# Patient Record
Sex: Female | Born: 1960
Health system: Southern US, Community
[De-identification: ages and names within clinical notes are randomized; demographics above are authoritative.]

## PROBLEM LIST (undated history)

## (undated) DIAGNOSIS — K219 Gastro-esophageal reflux disease without esophagitis: Secondary | ICD-10-CM

## (undated) DIAGNOSIS — C801 Malignant (primary) neoplasm, unspecified: Secondary | ICD-10-CM

## (undated) DIAGNOSIS — K859 Acute pancreatitis without necrosis or infection, unspecified: Secondary | ICD-10-CM

## (undated) DIAGNOSIS — I499 Cardiac arrhythmia, unspecified: Secondary | ICD-10-CM

## (undated) DIAGNOSIS — I10 Essential (primary) hypertension: Secondary | ICD-10-CM

## (undated) HISTORY — PX: ABDOMINAL SURGERY: SHX537

## (undated) HISTORY — PX: OTHER SURGICAL HISTORY: SHX169

## (undated) HISTORY — PX: ABDOMINAL HYSTERECTOMY: SHX81

## (undated) HISTORY — PX: CHOLECYSTECTOMY: SHX55

---

## 2017-06-16 ENCOUNTER — Other Ambulatory Visit: Payer: Self-pay

## 2017-06-16 ENCOUNTER — Emergency Department (HOSPITAL_BASED_OUTPATIENT_CLINIC_OR_DEPARTMENT_OTHER)
Admission: EM | Admit: 2017-06-16 | Discharge: 2017-06-16 | Disposition: A | Discharge status: Home / Self Care | Payer: Medicare Other | Attending: Emergency Medicine | Admitting: Emergency Medicine

## 2017-06-16 ENCOUNTER — Encounter (HOSPITAL_BASED_OUTPATIENT_CLINIC_OR_DEPARTMENT_OTHER): Payer: Self-pay | Admitting: Emergency Medicine

## 2017-06-16 ENCOUNTER — Emergency Department (HOSPITAL_BASED_OUTPATIENT_CLINIC_OR_DEPARTMENT_OTHER): Payer: Medicare Other

## 2017-06-16 DIAGNOSIS — Z79899 Other long term (current) drug therapy: Secondary | ICD-10-CM | POA: Diagnosis not present

## 2017-06-16 DIAGNOSIS — F419 Anxiety disorder, unspecified: Secondary | ICD-10-CM

## 2017-06-16 DIAGNOSIS — R51 Headache: Secondary | ICD-10-CM | POA: Diagnosis present

## 2017-06-16 DIAGNOSIS — Z8541 Personal history of malignant neoplasm of cervix uteri: Secondary | ICD-10-CM | POA: Insufficient documentation

## 2017-06-16 DIAGNOSIS — K861 Other chronic pancreatitis: Secondary | ICD-10-CM

## 2017-06-16 DIAGNOSIS — G4459 Other complicated headache syndrome: Secondary | ICD-10-CM

## 2017-06-16 DIAGNOSIS — I10 Essential (primary) hypertension: Secondary | ICD-10-CM | POA: Diagnosis not present

## 2017-06-16 HISTORY — DX: Cardiac arrhythmia, unspecified: I49.9

## 2017-06-16 HISTORY — DX: Malignant (primary) neoplasm, unspecified: C80.1

## 2017-06-16 HISTORY — DX: Gastro-esophageal reflux disease without esophagitis: K21.9

## 2017-06-16 HISTORY — DX: Essential (primary) hypertension: I10

## 2017-06-16 HISTORY — DX: Acute pancreatitis without necrosis or infection, unspecified: K85.90

## 2017-06-16 LAB — COMPREHENSIVE METABOLIC PANEL
ALBUMIN: 4.5 g/dL (ref 3.5–5.0)
ALK PHOS: 91 U/L (ref 38–126)
ALT: 16 U/L (ref 14–54)
ANION GAP: 10 (ref 5–15)
AST: 21 U/L (ref 15–41)
BILIRUBIN TOTAL: 0.5 mg/dL (ref 0.3–1.2)
BUN: 18 mg/dL (ref 6–20)
CALCIUM: 9.5 mg/dL (ref 8.9–10.3)
CO2: 23 mmol/L (ref 22–32)
CREATININE: 0.89 mg/dL (ref 0.44–1.00)
Chloride: 104 mmol/L (ref 101–111)
GFR calc Af Amer: >60 mL/min (ref >60)
GFR calc non Af Amer: >60 mL/min (ref >60)
GLUCOSE: 91 mg/dL (ref 65–99)
Potassium: 3.4 mmol/L — ABNORMAL LOW (ref 3.5–5.1)
SODIUM: 137 mmol/L (ref 135–145)
TOTAL PROTEIN: 8.5 g/dL — AB (ref 6.5–8.1)

## 2017-06-16 LAB — DIFFERENTIAL
Basophils Absolute: 0 10*3/uL (ref 0.0–0.1)
Basophils Relative: 0 %
EOS PCT: 2 %
Eosinophils Absolute: 0.2 10*3/uL (ref 0.0–0.7)
LYMPHS ABS: 1.7 10*3/uL (ref 0.7–4.0)
LYMPHS PCT: 23 %
Monocytes Absolute: 0.5 10*3/uL (ref 0.1–1.0)
Monocytes Relative: 7 %
NEUTROS ABS: 5 10*3/uL (ref 1.7–7.7)
NEUTROS PCT: 68 %

## 2017-06-16 LAB — URINALYSIS, MICROSCOPIC (REFLEX)

## 2017-06-16 LAB — CBC
HCT: 38 % (ref 36.0–46.0)
HEMOGLOBIN: 13 g/dL (ref 12.0–15.0)
MCH: 30.5 pg (ref 26.0–34.0)
MCHC: 34.2 g/dL (ref 30.0–36.0)
MCV: 89.2 fL (ref 78.0–100.0)
Platelets: 235 10*3/uL (ref 150–400)
RBC: 4.26 MIL/uL (ref 3.87–5.11)
RDW: 12.1 % (ref 11.5–15.5)
WBC: 7.4 10*3/uL (ref 4.0–10.5)

## 2017-06-16 LAB — URINALYSIS, ROUTINE W REFLEX MICROSCOPIC
Bilirubin Urine: NEGATIVE
GLUCOSE, UA: NEGATIVE mg/dL
KETONES UR: NEGATIVE mg/dL
NITRITE: NEGATIVE
PROTEIN: NEGATIVE mg/dL
Specific Gravity, Urine: <1.005 — ABNORMAL LOW (ref 1.005–1.030)
pH: 6 (ref 5.0–8.0)

## 2017-06-16 LAB — RAPID URINE DRUG SCREEN, HOSP PERFORMED
Amphetamines: NOT DETECTED
BARBITURATES: NOT DETECTED
Benzodiazepines: NOT DETECTED
Cocaine: NOT DETECTED
Opiates: NOT DETECTED
TETRAHYDROCANNABINOL: NOT DETECTED

## 2017-06-16 LAB — PROTIME-INR
INR: 0.97
Prothrombin Time: 12.8 seconds (ref 11.4–15.2)

## 2017-06-16 LAB — LIPASE, BLOOD: Lipase: 25 U/L (ref 11–51)

## 2017-06-16 LAB — ETHANOL: Alcohol, Ethyl (B): <10 mg/dL (ref <10)

## 2017-06-16 LAB — APTT: aPTT: 33 seconds (ref 24–36)

## 2017-06-16 LAB — TROPONIN I: Troponin I: <0.03 ng/mL (ref <0.03)

## 2017-06-16 LAB — PREGNANCY, URINE: PREG TEST UR: NEGATIVE

## 2017-06-16 MED ORDER — SODIUM CHLORIDE 0.9 % IV BOLUS (SEPSIS)
1000.0000 mL | Freq: Once | INTRAVENOUS | Status: AC
  Administered 2017-06-16: 1000 mL via INTRAVENOUS

## 2017-06-16 MED ORDER — LORAZEPAM 2 MG/ML IJ SOLN
1.0000 mg | Freq: Once | INTRAMUSCULAR | Status: AC
  Administered 2017-06-16: 1 mg via INTRAVENOUS
  Filled 2017-06-16: qty 1

## 2017-06-16 MED ORDER — HEPARIN SOD (PORK) LOCK FLUSH 100 UNIT/ML IV SOLN
INTRAVENOUS | Status: AC
  Administered 2017-06-16: 500 [IU]
  Filled 2017-06-16: qty 5

## 2017-06-16 MED ORDER — OXYCODONE-ACETAMINOPHEN 5-325 MG PO TABS: 1.0000 | ORAL_TABLET | ORAL | 0 refills | Status: DC | PRN

## 2017-06-16 MED ORDER — HYDROMORPHONE HCL 1 MG/ML IJ SOLN
1.0000 mg | Freq: Once | INTRAMUSCULAR | Status: AC
  Administered 2017-06-16: 1 mg via INTRAVENOUS
  Filled 2017-06-16: qty 1

## 2017-06-16 MED FILL — OXYCODONE-ACETAMINOPHEN 5-3: 5-325 | 1 days supply | Qty: 10 | Fill #0

## 2017-06-16 NOTE — ED Triage Notes (Signed)
Presents with slurred speech and feeling off balance that began yesterday, in the evening. Patient states that she went to bed with dizziness and headache  - patient states that she is stuttering since last night.

## 2017-06-16 NOTE — ED Notes (Signed)
Pt states, "My pancreas is really hurting. I usually get 4 grams of dilaudid when it acts up to make it stop. Please tell the doctor that 1 will not be enough" PRovider notified. Not further orders. Explained to patient that the physician will discuss her results with her shortly and we have given her 1 mg of dilaudid for pain at this time.

## 2017-06-16 NOTE — ED Notes (Signed)
ED Provider at bedside. 

## 2017-06-16 NOTE — ED Provider Notes (Signed)
[facility] EMERGENCY DEPARTMENT Provider Note   CSN: [id number] Arrival date & time: 06/16/17  1151     History   Chief Complaint Chief Complaint  Patient presents with  . Headache  . Aphasia    HPI Timeka Goette is a 56 y.o. female.  Patient is a 56 year old female with a history of hypertension who presents with headache and stuttering speech.  She states that her headache started last night at 10 PM.  At that time she also noticed that she was having trouble with her balance and was having difficulty with her speech which she describes as stuttering speech.  She has a worsening headache to the left side of her head.  She denies any neck pain.  She has had no vomiting or fevers.  She does note ongoing problem with her balance and stuttering speech.  No prior history of stroke.  No prior history of similar headaches.  She states that her speech is generally blurry but denies any other speech changes.  She denies any slurred speech or difficulty getting her words out other than the stuttering.      Past Medical History:  Diagnosis Date  . Cancer (HCC)    cervical  . GERD (gastroesophageal reflux disease)   . Hypertension   . Irregular heart rate   . Pancreatitis     There are no active problems to display for this patient.   Past Surgical History:  Procedure Laterality Date  . ABDOMINAL HYSTERECTOMY    . ABDOMINAL SURGERY    . CHOLECYSTECTOMY    . implanted heart monitor      OB History    No data available       Home Medications    Prior to Admission medications   Medication Sig Start Date End Date Taking? Authorizing Provider  atenolol (TENORMIN) 50 MG tablet Take 50 mg by mouth daily.   Yes [provider]  gabapentin (NEURONTIN) 600 MG tablet Take 600 mg by mouth 4 (four) times daily.   Yes [provider]  tiZANidine (ZANAFLEX) 4 MG capsule Take 4 mg by mouth 4 (four) times daily.   Yes [provider]    oxyCODONE-acetaminophen (PERCOCET) 5-325 MG tablet Take 1-2 tablets by mouth every 4 (four) hours as needed. 06/16/17   Malvin Johns, MD    Family History History reviewed. No pertinent family history.  Social History Social History   Tobacco Use  . Smoking status: Never Smoker  . Smokeless tobacco: Never Used  Substance Use Topics  . Alcohol use: No    Frequency: Never  . Drug use: No     Allergies   Ivp dye [iodinated diagnostic agents]; Ketorolac; Morphine and related; and Tramadol   Review of Systems Review of Systems  Constitutional: Negative for chills, diaphoresis, fatigue and fever.  HENT: Negative for congestion, rhinorrhea and sneezing.   Eyes: Positive for visual disturbance.  Respiratory: Negative for cough, chest tightness and shortness of breath.   Cardiovascular: Negative for chest pain and leg swelling.  Gastrointestinal: Negative for abdominal pain, blood in stool, diarrhea, nausea and vomiting.  Genitourinary: Negative for difficulty urinating, flank pain, frequency and hematuria.  Musculoskeletal: Negative for arthralgias and back pain.  Skin: Negative for rash.  Neurological: Positive for speech difficulty and headaches. Negative for dizziness, weakness and numbness.     Physical Exam Updated Vital Signs BP (!) 138/96 (BP Location: Right Arm)   Pulse (!) 111   Temp (!) 97.5 F (36.4  C) (Oral)   Resp 20   Wt 52.2 kg (115 lb)   SpO2 100%   Physical Exam  Constitutional: She is oriented to person, place, and time. She appears well-developed and well-nourished.  Patient appears to be very anxious and is tremulous in all extremities without noted seizure activity.  HENT:  Head: Normocephalic and atraumatic.  Eyes: Pupils are equal, round, and reactive to light.  Neck: Normal range of motion. Neck supple.  Cardiovascular: Normal rate, regular rhythm and normal heart sounds.  Pulmonary/Chest: Effort normal and breath sounds normal. No  respiratory distress. She has no wheezes. She has no rales. She exhibits no tenderness.  Abdominal: Soft. Bowel sounds are normal. There is no tenderness. There is no rebound and no guarding.  Musculoskeletal: Normal range of motion. She exhibits no edema.  Lymphadenopathy:    She has no cervical adenopathy.  Neurological: She is alert and oriented to person, place, and time.  I do not appreciate any slurred speech.  There is no aphasia.  There is no facial drooping.  She has normal sensation in all nerve quadrants of the face.  She has normal eye movements.  I do not appreciate any visual field deficits.  She has 5 out of 5 strength in all extremities without drift.  She has normal sensation to light touch in all extremities.  Skin: Skin is warm and dry. No rash noted.  Psychiatric: Her mood appears anxious.     ED Treatments / Results  Labs (all labs ordered are listed, but only abnormal results are displayed) Labs Reviewed  COMPREHENSIVE METABOLIC PANEL - Abnormal; Notable for the following components:      Result Value   Potassium 3.4 (*)    Total Protein 8.5 (*)    All other components within normal limits  URINALYSIS, ROUTINE W REFLEX MICROSCOPIC - Abnormal; Notable for the following components:   Specific Gravity, Urine <1.005 (*)    Hgb urine dipstick SMALL (*)    Leukocytes, UA TRACE (*)    All other components within normal limits  URINALYSIS, MICROSCOPIC (REFLEX) - Abnormal; Notable for the following components:   Bacteria, UA RARE (*)    Squamous Epithelial / LPF 0-5 (*)    All other components within normal limits  ETHANOL  PROTIME-INR  APTT  CBC  DIFFERENTIAL  TROPONIN I  RAPID URINE DRUG SCREEN, HOSP PERFORMED  PREGNANCY, URINE  LIPASE, BLOOD    EKG  EKG Interpretation  Date/Time:  Friday June 16 2017 12:12:27 EST Ventricular Rate:  101 PR Interval:    QRS Duration: 94 QT Interval:  368 QTC Calculation: 475 R Axis:   90 Text Interpretation:  Sinus  tachycardia Borderline right axis deviation Minimal ST depression, inferior leads since last tracing no significant change Confirmed by Malvin Johns [id number]) on 06/16/2017 12:14:54 PM       Radiology Ct Head Wo Contrast  Result Date: 06/16/2017 CLINICAL DATA:  Slurred speech with loss of balance since last night. Dizziness with headache. Code stroke. EXAM: CT HEAD WITHOUT CONTRAST TECHNIQUE: Contiguous axial images were obtained from the base of the skull through the vertex without intravenous contrast. COMPARISON:  None. FINDINGS: Brain: The patient's head is mildly tilted in the CT gantry. There is no evidence of acute intracranial hemorrhage, mass lesion, brain edema or extra-axial fluid collection. The ventricles and subarachnoid spaces are appropriately sized for age. There is no CT evidence of acute cortical infarction. Vascular: Intracranial vascular calcifications are present. No hyperdense vessel identified.  Skull: Negative for fracture or focal lesion. Sinuses/Orbits: The visualized paranasal sinuses and mastoid air cells are clear. No orbital abnormalities are seen. Other: None. IMPRESSION: No acute intracranial findings. No evidence of acute stroke or hemorrhage. These results were called by telephone at the time of interpretation on 06/16/2017 at 12:22 pm to Dr. Threasa Beards Paolina Karwowski , who verbally acknowledged these results. Electronically Signed   By: Richardean Sale M.D.   On: 06/16/2017 12:23    Procedures Procedures (including critical care time)  Medications Ordered in ED Medications  sodium chloride 0.9 % bolus 1,000 mL (1,000 mLs Intravenous New Bag/Given 06/16/17 1517)  LORazepam (ATIVAN) injection 1 mg (1 mg Intravenous Given 06/16/17 1252)  HYDROmorphone (DILAUDID) injection 1 mg (1 mg Intravenous Given 06/16/17 1338)     Initial Impression / Assessment and Plan / ED Course  I have reviewed the triage vital signs and the nursing notes.  Pertinent labs & imaging results that  were available during my care of the patient were reviewed by me and considered in my medical decision making (see chart for details).     12:08 pt with stuttering speech and headache with reported balance issues.  Out of window for tPA and VAN negative.  Patient presents with symptoms of headache associated with stuttering speech and balance issues with blurry vision.  Her symptoms are not any classic pattern that would fit a stroke.  She did have a head CT and there is no evidence of acute hemorrhage or infarct as detectable on CT.  She was given a dose of Ativan as she seemed to be very and anxious and panicky and this did resolve her symptoms.  She has no focal deficits.  Her headache is completely resolved.  She does not have symptoms that would be more concerning for subarachnoid hemorrhage.  She is able to ambulate without ataxia.  Her husband states that even though she has denied anxiety history to me that she has had similar type presentations in the past and was on Xanax which her doctor took her off of.  As she was in the emergency department she started having pain in her upper abdomen.  She states that she has a history of pancreatitis and needs something for pain.  I ordered her a dose of Dilaudid and she states that she normally takes 4 mg in the 1 mg will not help her.  I was not comfortable with this given that she just received Ativan as well and she seemed comfortable with 1 mg of Dilaudid.  She does not have any elevation in her lipase.  Her other labs are non-concerning.  She does have some mild ongoing tachycardia.  On discharge conversation, her heart rate was 105.  She does not appear to be in any distress.  She has no shortness of breath or hypoxia.  Her blood pressure is stable.  She is constantly asking for more pain medication which I do not feel is appropriate.  I looked her up in the drug database and she was previously receiving chronic pain medication prescriptions which the  last one she received was a month ago.  She recently moved here.  I gave her a prescription for 10 tablets of oxycodone.  I advised her that we would not be able to give any more than that.  I did give her a resource sheet for possible outpatient follow-up.  Return precautions were given.  Final Clinical Impressions(s) / ED Diagnoses   Final diagnoses:  Other  complicated headache syndrome  Anxiety  Chronic pancreatitis, unspecified pancreatitis type [facility])    ED Discharge Orders        Ordered    oxyCODONE-acetaminophen (PERCOCET) 5-325 MG tablet  Every 4 hours PRN     06/16/17 1603       Malvin Johns, MD 06/16/17 1608

## 2017-06-16 NOTE — ED Notes (Signed)
PT ambulated in hall, she complained of dizziness but stated it was better than it was earlier. She is asking for Dilaudid.

## 2018-03-18 ENCOUNTER — Encounter (HOSPITAL_BASED_OUTPATIENT_CLINIC_OR_DEPARTMENT_OTHER): Payer: Self-pay | Admitting: *Deleted

## 2018-03-18 ENCOUNTER — Emergency Department (HOSPITAL_BASED_OUTPATIENT_CLINIC_OR_DEPARTMENT_OTHER)
Admission: EM | Admit: 2018-03-18 | Discharge: 2018-03-19 | Disposition: A | Discharge status: Home / Self Care | Payer: Medicare Other | Attending: Emergency Medicine | Admitting: Emergency Medicine

## 2018-03-18 ENCOUNTER — Emergency Department (HOSPITAL_BASED_OUTPATIENT_CLINIC_OR_DEPARTMENT_OTHER): Payer: Medicare Other

## 2018-03-18 ENCOUNTER — Other Ambulatory Visit: Payer: Self-pay

## 2018-03-18 DIAGNOSIS — I10 Essential (primary) hypertension: Secondary | ICD-10-CM | POA: Insufficient documentation

## 2018-03-18 DIAGNOSIS — R112 Nausea with vomiting, unspecified: Secondary | ICD-10-CM | POA: Diagnosis not present

## 2018-03-18 DIAGNOSIS — R1013 Epigastric pain: Secondary | ICD-10-CM

## 2018-03-18 DIAGNOSIS — Z79899 Other long term (current) drug therapy: Secondary | ICD-10-CM | POA: Insufficient documentation

## 2018-03-18 DIAGNOSIS — R197 Diarrhea, unspecified: Secondary | ICD-10-CM | POA: Diagnosis not present

## 2018-03-18 DIAGNOSIS — R109 Unspecified abdominal pain: Secondary | ICD-10-CM | POA: Diagnosis present

## 2018-03-18 LAB — CBC
HEMATOCRIT: 36.8 % (ref 36.0–46.0)
HEMOGLOBIN: 12.7 g/dL (ref 12.0–15.0)
MCH: 30.5 pg (ref 26.0–34.0)
MCHC: 34.5 g/dL (ref 30.0–36.0)
MCV: 88.5 fL (ref 78.0–100.0)
Platelets: 263 10*3/uL (ref 150–400)
RBC: 4.16 MIL/uL (ref 3.87–5.11)
RDW: 12.2 % (ref 11.5–15.5)
WBC: 10.3 10*3/uL (ref 4.0–10.5)

## 2018-03-18 LAB — URINALYSIS, MICROSCOPIC (REFLEX)

## 2018-03-18 LAB — URINALYSIS, ROUTINE W REFLEX MICROSCOPIC
BILIRUBIN URINE: NEGATIVE
GLUCOSE, UA: NEGATIVE mg/dL
KETONES UR: NEGATIVE mg/dL
NITRITE: NEGATIVE
PH: 5 (ref 5.0–8.0)
Protein, ur: NEGATIVE mg/dL
Specific Gravity, Urine: 1.025 (ref 1.005–1.030)

## 2018-03-18 LAB — COMPREHENSIVE METABOLIC PANEL
ALBUMIN: 4 g/dL (ref 3.5–5.0)
ALT: 14 U/L (ref 0–44)
AST: 20 U/L (ref 15–41)
Alkaline Phosphatase: 83 U/L (ref 38–126)
Anion gap: 12 (ref 5–15)
BILIRUBIN TOTAL: 0.4 mg/dL (ref 0.3–1.2)
BUN: 24 mg/dL — AB (ref 6–20)
CHLORIDE: 107 mmol/L (ref 98–111)
CO2: 21 mmol/L — AB (ref 22–32)
Calcium: 8.8 mg/dL — ABNORMAL LOW (ref 8.9–10.3)
Creatinine, Ser: 1.01 mg/dL — ABNORMAL HIGH (ref 0.44–1.00)
GFR calc Af Amer: >60 mL/min (ref >60)
GFR calc non Af Amer: >60 mL/min (ref >60)
GLUCOSE: 136 mg/dL — AB (ref 70–99)
POTASSIUM: 3.3 mmol/L — AB (ref 3.5–5.1)
SODIUM: 140 mmol/L (ref 135–145)
TOTAL PROTEIN: 7.6 g/dL (ref 6.5–8.1)

## 2018-03-18 LAB — LIPASE, BLOOD: Lipase: 49 U/L (ref 11–51)

## 2018-03-18 MED ORDER — LORAZEPAM 2 MG/ML IJ SOLN
1.0000 mg | Freq: Once | INTRAMUSCULAR | Status: AC
Start: 2018-03-18 — End: 2018-03-18
  Administered 2018-03-18: 1 mg via INTRAVENOUS
  Filled 2018-03-18: qty 1

## 2018-03-18 MED ORDER — HYDROMORPHONE HCL 1 MG/ML IJ SOLN
1.0000 mg | Freq: Once | INTRAMUSCULAR | Status: AC
  Administered 2018-03-18: 1 mg via INTRAVENOUS
  Filled 2018-03-18: qty 1

## 2018-03-18 MED ORDER — SODIUM CHLORIDE 0.9 % IV BOLUS
1000.0000 mL | Freq: Once | INTRAVENOUS | Status: AC
  Administered 2018-03-18: 1000 mL via INTRAVENOUS

## 2018-03-18 MED ORDER — ONDANSETRON HCL 4 MG/2ML IJ SOLN
4.0000 mg | Freq: Once | INTRAMUSCULAR | Status: AC
  Administered 2018-03-18: 4 mg via INTRAVENOUS
  Filled 2018-03-18: qty 2

## 2018-03-18 MED ORDER — KETOROLAC TROMETHAMINE 30 MG/ML IJ SOLN
30.0000 mg | Freq: Once | INTRAMUSCULAR | Status: AC
Start: 2018-03-18 — End: 2018-03-18
  Administered 2018-03-18: 30 mg via INTRAVENOUS
  Filled 2018-03-18: qty 1

## 2018-03-18 NOTE — ED Notes (Signed)
Pt was given at sprite for PO challenge

## 2018-03-18 NOTE — ED Provider Notes (Signed)
[facility] EMERGENCY DEPARTMENT Provider Note   CSN: [id number] Arrival date & time: 03/18/18  1955     History   Chief Complaint Chief Complaint  Patient presents with  . Abdominal Pain    HPI Rachel Pitts is a 57 y.o. female.  HPI   Rachel Pitts is a 57 year old female with a history of chronic pancreatitis, hypertension, irregular heart rate who presents to the emergency department for evaluation of epigastric and left upper quadrant abdominal pain and vomiting.  Patient reports that she is having a flareup of her pancreatitis pain.  Her symptoms started yesterday.  She reports that she has 10/10 severity constant, sharp epigastric and left upper quadrant pain which radiates to her back.  She has associated vomiting as well as diarrhea.  She reports 5 episodes of nonbloody green diarrhea today.  No recent antibiotic use or travel outside of the country.  She takes Percocet at home as needed for her pancreatitis flare, but this is not been helping with her symptoms.  She denies fevers, chills, hematemesis, flank pain, urinary frequency, dysuria, hematuria, chest pain, shortness of breath, cough, lightheadedness or syncope.  She has had a prior cholecystectomy and hysterectomy procedure.    Per chart review, patient has been evaluated for chronic pancreatitis by several gastroenterologists.  Per a note by Gastroenterologist Dr. Ardelia Mems 05/2015, patient has had >15 ERCPs.  Per his note, her chronic abdominal pain requiring narcotics is unlikely solely the the result of pancreatic pathology.   Past Medical History:  Diagnosis Date  . Cancer (HCC)    cervical  . GERD (gastroesophageal reflux disease)   . Hypertension   . Irregular heart rate   . Pancreatitis     There are no active problems to display for this patient.   Past Surgical History:  Procedure Laterality Date  . ABDOMINAL HYSTERECTOMY    . ABDOMINAL SURGERY    . CHOLECYSTECTOMY    . implanted heart  monitor       OB History   None      Home Medications    Prior to Admission medications   Medication Sig Start Date End Date Taking? Authorizing Provider  atenolol (TENORMIN) 50 MG tablet Take 50 mg by mouth daily.   Yes [provider]  gabapentin (NEURONTIN) 600 MG tablet Take 600 mg by mouth 4 (four) times daily.   Yes [provider]  oxyCODONE-acetaminophen (PERCOCET) 5-325 MG tablet Take 1-2 tablets by mouth every 4 (four) hours as needed. 06/16/17  Yes Malvin Johns, MD  tiZANidine (ZANAFLEX) 4 MG capsule Take 4 mg by mouth 4 (four) times daily.   Yes [provider]    Family History No family history on file.  Social History Social History   Tobacco Use  . Smoking status: Never Smoker  . Smokeless tobacco: Never Used  Substance Use Topics  . Alcohol use: No    Frequency: Never  . Drug use: No     Allergies   Ivp dye [iodinated diagnostic agents]; Ketorolac; Morphine and related; and Tramadol   Review of Systems Review of Systems  Constitutional: Negative for chills and fever.  Eyes: Negative for visual disturbance.  Respiratory: Negative for shortness of breath.   Cardiovascular: Negative for chest pain.  Gastrointestinal: Positive for abdominal pain, diarrhea, nausea and vomiting. Negative for blood in stool.  Genitourinary: Negative for difficulty urinating, dysuria, flank pain, frequency and vaginal bleeding.  Musculoskeletal: Negative for back pain and gait problem.  Skin: Negative  for rash.  Neurological: Negative for syncope and light-headedness.  Psychiatric/Behavioral: Negative for agitation.     Physical Exam Updated Vital Signs BP 138/82   Pulse 99   Temp 98.1 F (36.7 C)   Resp (!) 24   Ht 5' (1.524 m)   Wt 49.9 kg   SpO2 98%   BMI 21.48 kg/m   Physical Exam  Constitutional: She appears well-developed and well-nourished. No distress.  Appears anxious. Holding her abdomen and breathing quickly.     HENT:  Head: Normocephalic and atraumatic.  Mucous memories moist.  Eyes: Conjunctivae are normal. Right eye exhibits no discharge. Left eye exhibits no discharge.  Neck: Normal range of motion.  Cardiovascular:  Tachycardic, regular rhythm.  Pulmonary/Chest: Effort normal and breath sounds normal. No stridor. No respiratory distress. She has no wheezes. She has no rales.  Abdominal:  Abdomen soft and nondistended.  Tender to palpation in the epigastrium as well as the left upper quadrant.  No splenomegaly appreciated.  No guarding, rebound or rigidity.  No CVA tenderness.  Musculoskeletal: Normal range of motion.  Neurological: She is alert. Coordination normal.  Skin: Skin is warm and dry. She is not diaphoretic.  Psychiatric: She has a normal mood and affect. Her behavior is normal.  Nursing note and vitals reviewed.    ED Treatments / Results  Labs (all labs ordered are listed, but only abnormal results are displayed) Labs Reviewed  COMPREHENSIVE METABOLIC PANEL - Abnormal; Notable for the following components:      Result Value   Potassium 3.3 (*)    CO2 21 (*)    Glucose, Bld 136 (*)    BUN 24 (*)    Creatinine, Ser 1.01 (*)    Calcium 8.8 (*)    All other components within normal limits  URINALYSIS, ROUTINE W REFLEX MICROSCOPIC - Abnormal; Notable for the following components:   Hgb urine dipstick SMALL (*)    Leukocytes, UA SMALL (*)    All other components within normal limits  URINALYSIS, MICROSCOPIC (REFLEX) - Abnormal; Notable for the following components:   Bacteria, UA FEW (*)    All other components within normal limits  LIPASE, BLOOD  CBC    EKG None  Radiology Dg Abdomen 1 View  Result Date: 03/18/2018 CLINICAL DATA:  Epigastric pain for 2 days. Prior history of pancreatitis. Nausea and vomiting. EXAM: ABDOMEN - 1 VIEW COMPARISON:  None. FINDINGS: Bowel gas pattern is nonobstructive. No evidence of soft tissue mass or abnormal fluid collection. No  evidence of free intraperitoneal air, although the upper portions of the abdomen are excluded on this exam. No evidence of renal or ureteral calculi. Cholecystectomy clips in the RIGHT upper quadrant. No acute or suspicious osseous finding. IMPRESSION: Negative.  Nonobstructive bowel gas pattern Electronically Signed   By: Franki Cabot M.D.   On: 03/18/2018 23:12    Procedures Procedures (including critical care time)  Medications Ordered in ED Medications  HYDROmorphone (DILAUDID) injection 1 mg (1 mg Intravenous Given 03/18/18 2239)  ondansetron (ZOFRAN) injection 4 mg (4 mg Intravenous Given 03/18/18 2239)  sodium chloride 0.9 % bolus 1,000 mL (0 mLs Intravenous Stopped 03/18/18 2343)  ketorolac (TORADOL) 30 MG/ML injection 30 mg (30 mg Intravenous Given 03/18/18 2344)  LORazepam (ATIVAN) injection 1 mg (1 mg Intravenous Given 03/18/18 2344)  heparin lock flush 100 unit/mL (500 Units Intracatheter Given 03/19/18 0024)     Initial Impression / Assessment and Plan / ED Course  I have reviewed the triage  vital signs and the nursing notes.  Pertinent labs & imaging results that were available during my care of the patient were reviewed by me and considered in my medical decision making (see chart for details).     Patient with a history of chronic pancreatitis and chronic abdominal pain presents to the emergency department for evaluation of vomiting, epigastric pain and diarrhea.  She reports her symptoms are similar to prior flares.  On exam she appears very anxious.  She is tender to palpation in the epigastrium as well as left upper quadrant.  No peritoneal signs and I have no concern for acute surgical abdomen given exam findings.  Lab work unremarkable.  No leukocytosis.  Lipase WNL.  CMP without major lecture light abnormalities, liver enzymes WNL and creatinine WNL.  UA without evidence of infection.  Plain abdominal x-ray without obstructive gas pattern, no acute abnormality.  Patient was  treated with fluids, pain medication and antiemetic in the ED.  She reports subsequent improvement and is tolerating p.o. fluids at the bedside.  Her pain today is likely a flare of her chronic pain for which she has been seen by multiple GI specialists in the past. I do not suspect appendicitis, perforated viscus, bowel obstruction, diverticulitis, nephrolithiasis, pyelonephritis or other emergent abdominal etiology causing her symptoms.  Repeat abdominal exam soft.  Have discussed BRAT diet and importance of oral hydration at home.  Patient requesting Percocet at discharge. She has a history of chronic narcotic use. I have counseled her that we do not manage chronic pain with narcotics from the ED and she will have to follow up with her GI doctor for further pain management strategies.  I discussed return precautions and she agrees and appears reliable.  I discussed this patient with Dr. Johnney Killian who agrees with plan and discharge home.   Final diagnoses:  Epigastric pain  Non-intractable vomiting with nausea, unspecified vomiting type  Diarrhea, unspecified type    ED Discharge Orders    None       Bernarda Caffey 03/19/18 0032    Charlesetta Shanks, MD 03/23/18 [id number]

## 2018-03-18 NOTE — ED Notes (Signed)
Patient transported to X-ray 

## 2018-03-18 NOTE — ED Triage Notes (Addendum)
Pt reports abdominal pain since yesterday with vomiting. States she has a history of pancreatitis and this feels like a flare up. Reports vomited x 2 today. She has a portacath

## 2018-03-19 MED ORDER — HEPARIN SOD (PORK) LOCK FLUSH 100 UNIT/ML IV SOLN
500.0000 [IU] | Freq: Once | INTRAVENOUS | Status: AC
  Administered 2018-03-19: 500 [IU]
  Filled 2018-03-19: qty 5

## 2018-03-19 NOTE — Discharge Instructions (Signed)
Your blood work and abdominal x-ray were reassuring today.  Take your nausea medicine at home as needed for vomiting.  Stay hydrated with plenty of fluids.  Avoid spicy and greasy foods.  Stick to a bland and soft diet.  Return to the ER if you have any new or concerning symptoms like fever, vomiting that will not stop, abdominal pain that worsens or concerns you in any way.

## 2018-03-19 NOTE — ED Notes (Signed)
Pt understood dc material. NAD noted. 

## 2018-11-23 ENCOUNTER — Other Ambulatory Visit: Payer: Self-pay | Admitting: Nurse Practitioner

## 2018-11-23 DIAGNOSIS — N644 Mastodynia: Secondary | ICD-10-CM

## 2021-07-23 ENCOUNTER — Emergency Department (HOSPITAL_BASED_OUTPATIENT_CLINIC_OR_DEPARTMENT_OTHER)
Admission: EM | Admit: 2021-07-23 | Discharge: 2021-07-23 | Disposition: A | Discharge status: Home / Self Care | Payer: Medicare Other | Attending: Emergency Medicine | Admitting: Emergency Medicine

## 2021-07-23 ENCOUNTER — Emergency Department (HOSPITAL_BASED_OUTPATIENT_CLINIC_OR_DEPARTMENT_OTHER): Payer: Medicare Other

## 2021-07-23 ENCOUNTER — Other Ambulatory Visit: Payer: Self-pay

## 2021-07-23 ENCOUNTER — Encounter (HOSPITAL_BASED_OUTPATIENT_CLINIC_OR_DEPARTMENT_OTHER): Payer: Self-pay | Admitting: Emergency Medicine

## 2021-07-23 DIAGNOSIS — Z79899 Other long term (current) drug therapy: Secondary | ICD-10-CM | POA: Insufficient documentation

## 2021-07-23 DIAGNOSIS — I10 Essential (primary) hypertension: Secondary | ICD-10-CM | POA: Insufficient documentation

## 2021-07-23 DIAGNOSIS — R1084 Generalized abdominal pain: Secondary | ICD-10-CM

## 2021-07-23 DIAGNOSIS — R109 Unspecified abdominal pain: Secondary | ICD-10-CM | POA: Diagnosis present

## 2021-07-23 DIAGNOSIS — Z8541 Personal history of malignant neoplasm of cervix uteri: Secondary | ICD-10-CM | POA: Insufficient documentation

## 2021-07-23 LAB — CBC WITH DIFFERENTIAL/PLATELET
Abs Immature Granulocytes: 0.07 10*3/uL (ref 0.00–0.07)
Basophils Absolute: 0.1 10*3/uL (ref 0.0–0.1)
Basophils Relative: 0 %
Eosinophils Absolute: 0.2 10*3/uL (ref 0.0–0.5)
Eosinophils Relative: 1 %
HCT: 38.2 % (ref 36.0–46.0)
Hemoglobin: 13.3 g/dL (ref 12.0–15.0)
Immature Granulocytes: 1 %
Lymphocytes Relative: 15 %
Lymphs Abs: 2 10*3/uL (ref 0.7–4.0)
MCH: 31.3 pg (ref 26.0–34.0)
MCHC: 34.8 g/dL (ref 30.0–36.0)
MCV: 89.9 fL (ref 80.0–100.0)
Monocytes Absolute: 0.9 10*3/uL (ref 0.1–1.0)
Monocytes Relative: 6 %
Neutro Abs: 10.4 10*3/uL — ABNORMAL HIGH (ref 1.7–7.7)
Neutrophils Relative %: 77 %
Platelets: 240 10*3/uL (ref 150–400)
RBC: 4.25 MIL/uL (ref 3.87–5.11)
RDW: 12 % (ref 11.5–15.5)
WBC: 13.5 10*3/uL — ABNORMAL HIGH (ref 4.0–10.5)
nRBC: 0 % (ref 0.0–0.2)

## 2021-07-23 LAB — COMPREHENSIVE METABOLIC PANEL
ALT: 19 U/L (ref 0–44)
AST: 20 U/L (ref 15–41)
Albumin: 4.2 g/dL (ref 3.5–5.0)
Alkaline Phosphatase: 88 U/L (ref 38–126)
Anion gap: 12 (ref 5–15)
BUN: 15 mg/dL (ref 6–20)
CO2: 21 mmol/L — ABNORMAL LOW (ref 22–32)
Calcium: 9 mg/dL (ref 8.9–10.3)
Chloride: 105 mmol/L (ref 98–111)
Creatinine, Ser: 0.85 mg/dL (ref 0.44–1.00)
GFR, Estimated: >60 mL/min (ref >60)
Glucose, Bld: 103 mg/dL — ABNORMAL HIGH (ref 70–99)
Potassium: 3.6 mmol/L (ref 3.5–5.1)
Sodium: 138 mmol/L (ref 135–145)
Total Bilirubin: 0.8 mg/dL (ref 0.3–1.2)
Total Protein: 8.4 g/dL — ABNORMAL HIGH (ref 6.5–8.1)

## 2021-07-23 LAB — LIPASE, BLOOD: Lipase: 41 U/L (ref 11–51)

## 2021-07-23 LAB — LACTIC ACID, PLASMA: Lactic Acid, Venous: 1.2 mmol/L (ref 0.5–1.9)

## 2021-07-23 MED ORDER — ONDANSETRON HCL 4 MG/2ML IJ SOLN
4.0000 mg | Freq: Once | INTRAMUSCULAR | Status: AC
  Administered 2021-07-23: 12:00:00 4 mg via INTRAVENOUS
  Filled 2021-07-23: qty 2

## 2021-07-23 MED ORDER — SODIUM CHLORIDE 0.9 % IV BOLUS
1000.0000 mL | Freq: Once | INTRAVENOUS | Status: AC
  Administered 2021-07-23: 12:00:00 1000 mL via INTRAVENOUS

## 2021-07-23 MED ORDER — HYDROMORPHONE HCL 1 MG/ML IJ SOLN
1.0000 mg | Freq: Once | INTRAMUSCULAR | Status: AC
  Administered 2021-07-23: 13:00:00 1 mg via INTRAVENOUS
  Filled 2021-07-23: qty 1

## 2021-07-23 MED ORDER — HYDROMORPHONE HCL 1 MG/ML IJ SOLN
1.0000 mg | Freq: Once | INTRAMUSCULAR | Status: AC
  Administered 2021-07-23: 12:00:00 1 mg via INTRAVENOUS
  Filled 2021-07-23: qty 1

## 2021-07-23 MED ORDER — DICYCLOMINE HCL 10 MG PO CAPS
10.0000 mg | ORAL_CAPSULE | Freq: Once | ORAL | Status: AC
  Administered 2021-07-23: 15:00:00 10 mg via ORAL
  Filled 2021-07-23: qty 1

## 2021-07-23 NOTE — ED Triage Notes (Signed)
Pt having abdominal pain with rectal and vaginal bleeding, history of ca

## 2021-07-23 NOTE — ED Provider Notes (Signed)
[facility] Provider Note   CSN: [id number] Arrival date & time: 07/23/21  2952     History Chief Complaint  Patient presents with   Abdominal Pain   Rectal Bleeding   Vaginal Bleeding    Rachel Pitts is a 60 y.o. female.  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  Associated with loose stools noted to have bright red blood in her stools as well as bright red blood on toilet paper with wiping.  Denies dysuria, reports decreased urine output.  Denies fevers, chills.  Does report nausea and vomiting (nonbloody nonbilious emesis).  States does not feel similar to prior episodes of pancreatitis.  Not anticoagulated.  Unable to keep pain medications and antiemetics down at home due to her vomiting.      Past Medical History:  Diagnosis Date   Cancer ([place])    cervical   GERD (gastroesophageal reflux disease)    Hypertension    Irregular heart rate    Pancreatitis     There are no problems to display for this patient.   Past Surgical History:  Procedure Laterality Date   ABDOMINAL HYSTERECTOMY     ABDOMINAL SURGERY     CHOLECYSTECTOMY     implanted heart monitor       OB History   No obstetric history on file.     No family history on file.  Social History   Tobacco Use   Smoking status: Never   Smokeless tobacco: Never  Substance Use Topics   Alcohol use: No   Drug use: No    Home Medications Prior to Admission medications   Medication Sig Start Date End Date Taking? Authorizing Provider  atenolol (TENORMIN) 50 MG tablet Take 50 mg  by mouth daily.    [provider]  gabapentin (NEURONTIN) 600 MG tablet Take 600 mg by mouth 4 (four) times daily.    [provider]  oxyCODONE-acetaminophen (PERCOCET) 5-325 MG tablet Take 1-2 tablets by mouth every 4 (four) hours as needed. 06/16/17   Malvin Johns, MD  tiZANidine (ZANAFLEX) 4 MG capsule Take 4 mg by mouth 4 (four) times daily.    [provider]    Allergies    Ivp dye [iodinated contrast media], Ketorolac, Morphine and related, and Tramadol  Review of Systems   Review of Systems  Constitutional:  Negative for chills and fever.  Respiratory:  Negative for shortness of breath.   Cardiovascular:  Negative for chest pain.  Gastrointestinal:  Positive for abdominal pain, blood in stool, diarrhea, nausea and vomiting. Negative for constipation.  Genitourinary:  Positive for decreased urine volume. Negative for dysuria.  Musculoskeletal:  Positive for back pain.  Skin:  Negative for rash and wound.  Allergic/Immunologic: Negative for immunocompromised state.  Neurological:  Negative for weakness.  Hematological:  Negative for adenopathy. Does not bruise/bleed easily.  Psychiatric/Behavioral:  Negative for confusion.   All other systems reviewed and are negative.  Physical Exam Updated Vital Signs BP (!) 165/105    Pulse (!) 102    Temp 98.4 F (36.9 C) (Oral)    Resp 15    Ht 5' (1.524 m)    Wt 47.6 kg  SpO2 100%    BMI 20.51 kg/m   Physical Exam Vitals and nursing note reviewed.  Constitutional:      General: She is not in acute distress.    Appearance: She is well-developed. She is not diaphoretic.     Comments: Rolling around on the bed, appears to be in pain.  HENT:     Head: Normocephalic and atraumatic.  Cardiovascular:     Rate and Rhythm: Regular rhythm. Tachycardia present.     Heart sounds: Normal heart sounds.  Pulmonary:     Effort: Pulmonary effort is normal.     Breath sounds: Normal breath sounds.  Abdominal:      General: A surgical scar is present.     Palpations: Abdomen is soft.     Tenderness: There is abdominal tenderness in the left upper quadrant and left lower quadrant.  Skin:    General: Skin is warm and dry.     Findings: No erythema or rash.  Neurological:     Mental Status: She is alert and oriented to person, place, and time.  Psychiatric:        Behavior: Behavior normal.    ED Results / Procedures / Treatments   Labs (all labs ordered are listed, but only abnormal results are displayed) Labs Reviewed  CBC WITH DIFFERENTIAL/PLATELET - Abnormal; Notable for the following components:      Result Value   WBC 13.5 (*)    Neutro Abs 10.4 (*)    All other components within normal limits  COMPREHENSIVE METABOLIC PANEL - Abnormal; Notable for the following components:   CO2 21 (*)    Glucose, Bld 103 (*)    Total Protein 8.4 (*)    All other components within normal limits  LIPASE, BLOOD  LACTIC ACID, PLASMA  URINALYSIS, ROUTINE W REFLEX MICROSCOPIC    EKG None  Radiology CT Abdomen Pelvis Wo Contrast  Result Date: 07/23/2021 CLINICAL DATA:  Acute left-sided abdominal pain.  Rectal bleeding. EXAM: CT ABDOMEN AND PELVIS WITHOUT CONTRAST TECHNIQUE: Multidetector CT imaging of the abdomen and pelvis was performed following the standard protocol without IV contrast. COMPARISON:  None. FINDINGS: Lower chest: No acute abnormality. Hepatobiliary: Status post cholecystectomy. Left hepatic pneumobilia is noted most consistent with post cholecystectomy status. No biliary dilatation is noted. Liver is unremarkable on these unenhanced images. Pancreas: Unremarkable. No pancreatic ductal dilatation or surrounding inflammatory changes. Spleen: Normal in size without focal abnormality. Adrenals/Urinary Tract: Adrenal glands are unremarkable. Kidneys are normal, without renal calculi, focal lesion, or hydronephrosis. Bladder is unremarkable. Stomach/Bowel: Stomach is within normal limits.  Appendix appears normal. No evidence of bowel wall thickening, distention, or inflammatory changes. Vascular/Lymphatic: No significant vascular findings are present. No enlarged abdominal or pelvic lymph nodes. Reproductive: Status post hysterectomy. No adnexal masses. Other: No abdominal wall hernia or abnormality. No abdominopelvic ascites. Musculoskeletal: No acute or significant osseous findings. IMPRESSION: Status post cholecystectomy. No acute abnormality seen in the abdomen or pelvis. Electronically Signed   By: Marijo Conception M.D.   On: 07/23/2021 12:21    Procedures Procedures   Medications Ordered in ED Medications  dicyclomine (BENTYL) capsule 10 mg (has no administration in time range)  sodium chloride 0.9 % bolus 1,000 mL (0 mLs Intravenous Stopped 07/23/21 1306)  ondansetron (ZOFRAN) injection 4 mg (4 mg Intravenous Given 07/23/21 1139)  HYDROmorphone (DILAUDID) injection 1 mg (1 mg Intravenous Given 07/23/21 1139)  HYDROmorphone (DILAUDID) injection 1 mg (1 mg Intravenous Given 07/23/21 1316)  ED Course  I have reviewed the triage vital signs and the nursing notes.  Pertinent labs & imaging results that were available during my care of the patient were reviewed by me and considered in my medical decision making (see chart for details).  Clinical Course as of 07/23/21 1423  Fri Jul 23, 2021  1415 HR 98 with BP 114/97 at time of DC. [LM]    Clinical Course User Index [LM] Roque Lias   MDM Rules/Calculators/A&P                         This patient presents to the ED for concern of abdominal pain with diarrhea, this involves an extensive number of treatment options, and is a complaint that carries with it a high risk of complications and morbidity.  The differential diagnosis includes gastroenteritis, gastritis, diverticulitis, GI bleed, dissection, mesenteric ischemia, nephrolithiasis.   Additional history obtained:  Additional history obtained from husband  at bedside External records from outside source obtained and reviewed including 06/21/20 outside ER, similar presentation, not well controlled with meds, thought to be possible abdominal migraine with work up otherwise reassuring.    Lab Tests:  I Ordered, reviewed, and interpreted labs.  The pertinent results include: CBC, CMP, lipase, lactic acid   Imaging Studies ordered:  I ordered imaging studies including CT abdomen pelvis without IV contrast due to allergy (shortness of breath) I independently visualized and interpreted imaging which showed no acute abnormality I agree with the radiologist interpretation   Cardiac Monitoring:  The patient was maintained on a cardiac monitor.  I personally viewed and interpreted the cardiac monitored which showed an underlying rhythm of: Normal sinus rhythm, rate 98   Medicines ordered and prescription drug management:  I ordered medication including Dilaudid, Zofran, Bentyl for nausea, vomiting, pain Reevaluation of the patient after these medicines showed that the patient improved appeared to improve, patient appeared more comfortable, vitals improved, patient reported ongoing pain and was given additional dose of Dilaudid.  At time of discharge, requested additional pain medication, will try Bentyl. I have reviewed the patients home medicines and have made adjustments as needed   Critical Interventions:  None   Consultations Obtained:  I requested consultation with the attending ER physician,  and discussed lab and imaging findings as well as pertinent plan - they recommend: follow up with pts GI   Problem List / ED Course:  Work-up today is overall reassuring.  CBC with mild leukocytosis with white count of 13.5, no source for infection found.  CMP without significant changes.  Lipase within normal limits, lactic acid reassuring at 1.2.  Patient has not provided a urine sample, does not have any urinary symptoms.  Urinalysis was  canceled.  CT abdomen pelvis unrevealing.  Plan is for follow-up with patient's GI.   Reevaluation:  After the interventions noted above, I reevaluated the patient and found that they have :improved   Dispostion:  After consideration of the diagnostic results and the patients response to treatment feel that the patent would benefit from follow up with GI.      Final Clinical Impression(s) / ED Diagnoses Final diagnoses:  Generalized abdominal pain    Rx / DC Orders ED Discharge Orders     None        Tacy Learn, PA-C 07/23/21 1423    Hayden Rasmussen, MD 07/23/21 [phone]

## 2021-07-23 NOTE — Discharge Instructions (Addendum)
Follow-up with your gastroenterologist for recheck as discussed.

## 2022-09-17 ENCOUNTER — Other Ambulatory Visit: Payer: Self-pay

## 2022-09-17 ENCOUNTER — Emergency Department (HOSPITAL_BASED_OUTPATIENT_CLINIC_OR_DEPARTMENT_OTHER): Payer: 59

## 2022-09-17 ENCOUNTER — Encounter (HOSPITAL_BASED_OUTPATIENT_CLINIC_OR_DEPARTMENT_OTHER): Payer: Self-pay | Admitting: Emergency Medicine

## 2022-09-17 ENCOUNTER — Emergency Department (HOSPITAL_BASED_OUTPATIENT_CLINIC_OR_DEPARTMENT_OTHER)
Admission: EM | Admit: 2022-09-17 | Discharge: 2022-09-17 | Disposition: A | Discharge status: Home / Self Care | Payer: 59 | Attending: Emergency Medicine | Admitting: Emergency Medicine

## 2022-09-17 DIAGNOSIS — R1013 Epigastric pain: Secondary | ICD-10-CM | POA: Diagnosis not present

## 2022-09-17 DIAGNOSIS — I10 Essential (primary) hypertension: Secondary | ICD-10-CM | POA: Diagnosis not present

## 2022-09-17 DIAGNOSIS — Z8541 Personal history of malignant neoplasm of cervix uteri: Secondary | ICD-10-CM | POA: Diagnosis not present

## 2022-09-17 DIAGNOSIS — R112 Nausea with vomiting, unspecified: Secondary | ICD-10-CM | POA: Diagnosis not present

## 2022-09-17 DIAGNOSIS — R109 Unspecified abdominal pain: Secondary | ICD-10-CM | POA: Diagnosis present

## 2022-09-17 LAB — CBC WITH DIFFERENTIAL/PLATELET
Abs Immature Granulocytes: 0.04 10*3/uL (ref 0.00–0.07)
Basophils Absolute: 0 10*3/uL (ref 0.0–0.1)
Basophils Relative: 0 %
Eosinophils Absolute: 0.1 10*3/uL (ref 0.0–0.5)
Eosinophils Relative: 1 %
HCT: 35.9 % — ABNORMAL LOW (ref 36.0–46.0)
Hemoglobin: 12.5 g/dL (ref 12.0–15.0)
Immature Granulocytes: 1 %
Lymphocytes Relative: 23 %
Lymphs Abs: 1.9 10*3/uL (ref 0.7–4.0)
MCH: 30.2 pg (ref 26.0–34.0)
MCHC: 34.8 g/dL (ref 30.0–36.0)
MCV: 86.7 fL (ref 80.0–100.0)
Monocytes Absolute: 0.5 10*3/uL (ref 0.1–1.0)
Monocytes Relative: 7 %
Neutro Abs: 5.6 10*3/uL (ref 1.7–7.7)
Neutrophils Relative %: 68 %
Platelets: 205 10*3/uL (ref 150–400)
RBC: 4.14 MIL/uL (ref 3.87–5.11)
RDW: 12.3 % (ref 11.5–15.5)
WBC: 8.2 10*3/uL (ref 4.0–10.5)
nRBC: 0 % (ref 0.0–0.2)

## 2022-09-17 LAB — URINALYSIS, MICROSCOPIC (REFLEX)

## 2022-09-17 LAB — URINALYSIS, ROUTINE W REFLEX MICROSCOPIC
Bilirubin Urine: NEGATIVE
Glucose, UA: NEGATIVE mg/dL
Ketones, ur: NEGATIVE mg/dL
Leukocytes,Ua: NEGATIVE
Nitrite: NEGATIVE
Protein, ur: NEGATIVE mg/dL
Specific Gravity, Urine: <=1.005 (ref 1.005–1.030)
pH: 5.5 (ref 5.0–8.0)

## 2022-09-17 LAB — COMPREHENSIVE METABOLIC PANEL
ALT: 13 U/L (ref 0–44)
AST: 16 U/L (ref 15–41)
Albumin: 3.8 g/dL (ref 3.5–5.0)
Alkaline Phosphatase: 71 U/L (ref 38–126)
Anion gap: 8 (ref 5–15)
BUN: 11 mg/dL (ref 8–23)
CO2: 20 mmol/L — ABNORMAL LOW (ref 22–32)
Calcium: 8.2 mg/dL — ABNORMAL LOW (ref 8.9–10.3)
Chloride: 112 mmol/L — ABNORMAL HIGH (ref 98–111)
Creatinine, Ser: 0.78 mg/dL (ref 0.44–1.00)
GFR, Estimated: >60 mL/min (ref >60)
Glucose, Bld: 85 mg/dL (ref 70–99)
Potassium: 2.9 mmol/L — ABNORMAL LOW (ref 3.5–5.1)
Sodium: 140 mmol/L (ref 135–145)
Total Bilirubin: 0.8 mg/dL (ref 0.3–1.2)
Total Protein: 7.4 g/dL (ref 6.5–8.1)

## 2022-09-17 LAB — LIPASE, BLOOD: Lipase: 33 U/L (ref 11–51)

## 2022-09-17 MED ORDER — HEPARIN SOD (PORK) LOCK FLUSH 100 UNIT/ML IV SOLN
INTRAVENOUS | Status: AC
  Administered 2022-09-17: 500 [IU]
  Filled 2022-09-17: qty 5

## 2022-09-17 MED ORDER — HYDROMORPHONE HCL 1 MG/ML IJ SOLN
1.0000 mg | Freq: Once | INTRAMUSCULAR | Status: AC
Start: 2022-09-17 — End: 2022-09-17
  Administered 2022-09-17: 1 mg via INTRAVENOUS
  Filled 2022-09-17: qty 1

## 2022-09-17 MED ORDER — POTASSIUM CHLORIDE CRYS ER 20 MEQ PO TBCR: 20.0000 meq | EXTENDED_RELEASE_TABLET | Freq: Two times a day (BID) | ORAL | 0 refills | Status: DC

## 2022-09-17 MED ORDER — OXYCODONE-ACETAMINOPHEN 5-325 MG PO TABS: 1.0000 | ORAL_TABLET | Freq: Four times a day (QID) | ORAL | 0 refills | Status: DC | PRN

## 2022-09-17 MED ORDER — FENTANYL CITRATE PF 50 MCG/ML IJ SOSY
50.0000 ug | PREFILLED_SYRINGE | Freq: Once | INTRAMUSCULAR | Status: AC
  Administered 2022-09-17: 50 ug via INTRAVENOUS
  Filled 2022-09-17: qty 1

## 2022-09-17 MED ORDER — HEPARIN SOD (PORK) LOCK FLUSH 100 UNIT/ML IV SOLN: 500.0000 [IU] | Freq: Once | INTRAVENOUS | Status: AC

## 2022-09-17 MED ORDER — POTASSIUM CHLORIDE CRYS ER 20 MEQ PO TBCR
40.0000 meq | EXTENDED_RELEASE_TABLET | Freq: Once | ORAL | Status: AC
  Administered 2022-09-17: 40 meq via ORAL
  Filled 2022-09-17: qty 2

## 2022-09-17 MED ORDER — ONDANSETRON HCL 4 MG/2ML IJ SOLN
4.0000 mg | Freq: Once | INTRAMUSCULAR | Status: AC
  Administered 2022-09-17: 4 mg via INTRAVENOUS
  Filled 2022-09-17: qty 2

## 2022-09-17 MED ORDER — PROMETHAZINE HCL 25 MG PO TABS: 25.0000 mg | ORAL_TABLET | Freq: Three times a day (TID) | ORAL | 0 refills | Status: DC | PRN

## 2022-09-17 MED ORDER — SODIUM CHLORIDE 0.9 % IV BOLUS
1000.0000 mL | Freq: Once | INTRAVENOUS | Status: AC
Start: 2022-09-17 — End: 2022-09-17
  Administered 2022-09-17: 1000 mL via INTRAVENOUS

## 2022-09-17 NOTE — ED Notes (Signed)
Unable to get blood from [place] access.

## 2022-09-17 NOTE — ED Provider Notes (Signed)
[facility] Provider Note   CSN: [id number] Arrival date & time: 09/17/22  0801     History  Chief Complaint  Patient presents with   Abdominal Pain    Rachel Pitts is a 62 y.o. female.  Patient here with nausea and vomiting and abdominal pain for the last few days.  History of pancreatitis.  Feels like the same.  History of cervical cancer, acid reflux, hypertension.  History of abdominal hysterectomy cholecystectomy in the past.  Denies any sick contacts.  Denies any fevers or chills.  No weakness or numbness or tingling.  Denies history of alcohol use.  The history is provided by the patient.       Home Medications Prior to Admission medications   Medication Sig Start Date End Date Taking? Authorizing Provider  potassium chloride SA (KLOR-CON M) 20 MEQ tablet Take 1 tablet (20 mEq total) by mouth 2 (two) times daily for 4 days. 09/17/22 09/21/22 Yes Destanie Tibbetts, DO  promethazine (PHENERGAN) 25 MG tablet Take 1 tablet (25 mg total) by mouth every 8 (eight) hours as needed for up to 15 doses for nausea or vomiting. 09/17/22  Yes Verta Riedlinger, DO  atenolol (TENORMIN) 50 MG tablet Take 50 mg by mouth daily.    [provider]  gabapentin (NEURONTIN) 600 MG tablet Take 600 mg by mouth 4 (four) times daily.    [provider]  oxyCODONE-acetaminophen (PERCOCET) 5-325 MG tablet Take 1 tablet by mouth every 6 (six) hours as needed for up to 10 doses. 09/17/22   Chamia Schmutz, DO  tiZANidine (ZANAFLEX) 4 MG capsule Take 4 mg by mouth 4 (four) times daily.    [provider]      Allergies    Ivp dye [iodinated contrast media], Ketorolac, Morphine and related, and Tramadol    Review of Systems   Review of Systems  Physical Exam Updated Vital Signs BP (!) 165/97   Pulse 96   Temp 98.1 F (36.7 C)   Resp 19   Ht 5' (1.524 m)   Wt 49.9 kg   SpO2 100%   BMI 21.48 kg/m  Physical Exam Vitals and  nursing note reviewed.  Constitutional:      General: She is not in acute distress.    Appearance: She is well-developed.  HENT:     Head: Normocephalic and atraumatic.     Nose: Nose normal.  Eyes:     Extraocular Movements: Extraocular movements intact.     Conjunctiva/sclera: Conjunctivae normal.     Pupils: Pupils are equal, round, and reactive to light.  Cardiovascular:     Rate and Rhythm: Normal rate and regular rhythm.     Pulses: Normal pulses.     Heart sounds: Normal heart sounds. No murmur heard. Pulmonary:     Effort: Pulmonary effort is normal. No respiratory distress.     Breath sounds: Normal breath sounds.  Abdominal:     Palpations: Abdomen is soft.     Tenderness: There is abdominal tenderness.  Musculoskeletal:        General: No swelling. Normal range of motion.     Cervical back: Normal range of motion and neck supple.  Skin:    General: Skin is warm and dry.     Capillary Refill: Capillary refill takes less than 2 seconds.  Neurological:     Mental Status: She is alert.  Psychiatric:        Mood and Affect: Mood  normal.     ED Results / Procedures / Treatments   Labs (all labs ordered are listed, but only abnormal results are displayed) Labs Reviewed  CBC WITH DIFFERENTIAL/PLATELET - Abnormal; Notable for the following components:      Result Value   HCT 35.9 (*)    All other components within normal limits  COMPREHENSIVE METABOLIC PANEL - Abnormal; Notable for the following components:   Potassium 2.9 (*)    Chloride 112 (*)    CO2 20 (*)    Calcium 8.2 (*)    All other components within normal limits  URINALYSIS, ROUTINE W REFLEX MICROSCOPIC - Abnormal; Notable for the following components:   Hgb urine dipstick SMALL (*)    All other components within normal limits  URINALYSIS, MICROSCOPIC (REFLEX) - Abnormal; Notable for the following components:   Bacteria, UA RARE (*)    All other components within normal limits  LIPASE, BLOOD     EKG EKG Interpretation  Date/Time:  Saturday September 17 2022 09:17:26 EST Ventricular Rate:  85 PR Interval:  120 QRS Duration: 75 QT Interval:  427 QTC Calculation: 508 R Axis:   75 Text Interpretation: Sinus rhythm Confirmed by Lennice Sites ([id number]) on 09/17/2022 10:07:58 AM  Radiology DG Chest Portable 1 View  Result Date: 09/17/2022 CLINICAL DATA:  62 year old female with abdominal pain. Port-A-Cath. EXAM: PORTABLE CHEST 1 VIEW COMPARISON:  CT Abdomen and Pelvis 0832 hours today. Portable chest 08/19/2022. FINDINGS: Portable AP upright view at 0843 hours. Stable left chest subclavian approach Port-A-Cath. Lung volumes and mediastinal contours within normal limits. Allowing for portable technique the lungs are clear. Chronic left thoracic inlet surgical clips. Visualized tracheal air column is within normal limits. No acute osseous abnormality identified. Nonobstructed visible bowel gas pattern. IMPRESSION: 1. No acute cardiopulmonary abnormality. 2. Stable left chest Port-A-Cath. Electronically Signed   By: Genevie Ann M.D.   On: 09/17/2022 08:49   CT ABDOMEN PELVIS WO CONTRAST  Result Date: 09/17/2022 CLINICAL DATA:  62 year old female with history of acute onset of nonlocalized abdominal pain and vomiting. Prior history of pancreatitis. EXAM: CT ABDOMEN AND PELVIS WITHOUT CONTRAST TECHNIQUE: Multidetector CT imaging of the abdomen and pelvis was performed following the standard protocol without IV contrast. RADIATION DOSE REDUCTION: This exam was performed according to the departmental dose-optimization program which includes automated exposure control, adjustment of the mA and/or kV according to patient size and/or use of iterative reconstruction technique. COMPARISON:  CT of the abdomen and pelvis 08/19/2022. FINDINGS: Lower chest: Unremarkable. Hepatobiliary: No suspicious cystic or solid hepatic lesions confidently identified on today's noncontrast CT examination. Status post  cholecystectomy. Pancreas: No definite pancreatic mass or peripancreatic fluid collections or inflammatory changes noted on today's noncontrast CT examination. Spleen: Unremarkable. Adrenals/Urinary Tract: There are no abnormal calcifications within the collecting system of either kidney, along the course of either ureter, or within the lumen of the urinary bladder. No hydroureteronephrosis or perinephric stranding to suggest urinary tract obstruction at this time. The unenhanced appearance of the kidneys is unremarkable bilaterally. Urinary bladder is unremarkable in appearance. Bilateral adrenal glands are normal in appearance. Stomach/Bowel: Unenhanced appearance of the stomach is unremarkable. No pathologic dilatation of small bowel or colon. Normal appendix. Vascular/Lymphatic: Atherosclerosis in the abdominal aorta and pelvic vasculature. No lymphadenopathy noted in the abdomen or pelvis. Reproductive: Status post hysterectomy. Ovaries are not confidently identified may be surgically absent or atrophic. Other: No significant volume of ascites.  No pneumoperitoneum. Musculoskeletal: There are no aggressive appearing lytic or  blastic lesions noted in the visualized portions of the skeleton. IMPRESSION: 1. No acute findings are noted in the abdomen or pelvis to account for the patient's symptoms. 2. Aortic atherosclerosis. 3. Incidental imaging findings, as above. Electronically Signed   By: Vinnie Langton M.D.   On: 09/17/2022 08:38    Procedures Procedures    Medications Ordered in ED Medications  potassium chloride SA (KLOR-CON M) CR tablet 40 mEq (has no administration in time range)  sodium chloride 0.9 % bolus 1,000 mL (0 mLs Intravenous Stopped 09/17/22 1104)  fentaNYL (SUBLIMAZE) injection 50 mcg (50 mcg Intravenous Given 09/17/22 0907)  ondansetron (ZOFRAN) injection 4 mg (4 mg Intravenous Given 09/17/22 0906)  HYDROmorphone (DILAUDID) injection 1 mg (1 mg Intravenous Given 09/17/22 [id number])     ED Course/ Medical Decision Making/ A&P                             Medical Decision Making Amount and/or Complexity of Data Reviewed Labs: ordered. Radiology: ordered.  Risk Prescription drug management.   Sequoyah Grider is here with abdominal pain.  History of pancreatitis, reflux, hypertension.  History of multiple abdominal surgeries including cholecystectomy hysterectomy.  Differential diagnosis includes pancreatitis versus less likely bowel obstruction, appendicitis, colitis.  Will get CBC, CMP, lipase, urinalysis, CT scan abdomen and pelvis.  It is also possible that this is likely acute on chronic pain.  She takes chronic narcotics for abdominal pain.  Sounds like she had complications with gallbladder surgery in the past.  Per radiology report CT scans normal.  No pancreatitis.  No bowel obstruction.  Overall suspect that this is likely a chronic pain crisis.  Will try to get her comfortable.  Awaiting blood work.  Blood work overall remarkable.  No urinary tract infection.  Per my review and interpretation there is no significant anemia or electrolyte abnormality or kidney injury.  Overall acute on chronic pain.  Will have her follow-up with primary care doctor.  Antiemetics and pain medicine refilled.  Potassium repletion given.  This chart was dictated using voice recognition software.  Despite best efforts to proofread,  errors can occur which can change the documentation meaning.         Final Clinical Impression(s) / ED Diagnoses Final diagnoses:  Epigastric pain    Rx / DC Orders ED Discharge Orders          Ordered    oxyCODONE-acetaminophen (PERCOCET) 5-325 MG tablet  Every 6 hours PRN        09/17/22 1043    promethazine (PHENERGAN) 25 MG tablet  Every 8 hours PRN        09/17/22 1043    potassium chloride SA (KLOR-CON M) 20 MEQ tablet  2 times daily        09/17/22 1127              Cur[place]oloQ[place]a Skye, DO 09/17/22 1130

## 2022-09-17 NOTE — ED Triage Notes (Signed)
Pt reports history of pancreatitis. Pt c/o abdominal pain with vomiting for the past couple of days.

## 2022-09-17 NOTE — Discharge Instructions (Addendum)
Overall lab work today is unremarkable.  Potassium is slightly low but this should improve.  I have also prescribed you potassium supplements for home as well as narcotic pain medicine and Phenergan.  Follow-up with your primary care doctor.

## 2022-10-12 ENCOUNTER — Encounter (HOSPITAL_BASED_OUTPATIENT_CLINIC_OR_DEPARTMENT_OTHER): Payer: Self-pay

## 2022-10-12 ENCOUNTER — Emergency Department (HOSPITAL_BASED_OUTPATIENT_CLINIC_OR_DEPARTMENT_OTHER): Payer: 59

## 2022-10-12 ENCOUNTER — Emergency Department (HOSPITAL_BASED_OUTPATIENT_CLINIC_OR_DEPARTMENT_OTHER)
Admission: EM | Admit: 2022-10-12 | Discharge: 2022-10-12 | Disposition: A | Discharge status: Home / Self Care | Payer: 59 | Attending: Emergency Medicine | Admitting: Emergency Medicine

## 2022-10-12 ENCOUNTER — Other Ambulatory Visit: Payer: Self-pay

## 2022-10-12 ENCOUNTER — Other Ambulatory Visit (HOSPITAL_BASED_OUTPATIENT_CLINIC_OR_DEPARTMENT_OTHER): Payer: Self-pay

## 2022-10-12 DIAGNOSIS — R000 Tachycardia, unspecified: Secondary | ICD-10-CM | POA: Diagnosis not present

## 2022-10-12 DIAGNOSIS — R112 Nausea with vomiting, unspecified: Secondary | ICD-10-CM | POA: Insufficient documentation

## 2022-10-12 DIAGNOSIS — K529 Noninfective gastroenteritis and colitis, unspecified: Secondary | ICD-10-CM

## 2022-10-12 DIAGNOSIS — R109 Unspecified abdominal pain: Secondary | ICD-10-CM | POA: Diagnosis not present

## 2022-10-12 DIAGNOSIS — D72829 Elevated white blood cell count, unspecified: Secondary | ICD-10-CM | POA: Insufficient documentation

## 2022-10-12 LAB — COMPREHENSIVE METABOLIC PANEL
ALT: 13 U/L (ref 0–44)
AST: 19 U/L (ref 15–41)
Albumin: 4.5 g/dL (ref 3.5–5.0)
Alkaline Phosphatase: 87 U/L (ref 38–126)
Anion gap: 11 (ref 5–15)
BUN: 18 mg/dL (ref 8–23)
CO2: 22 mmol/L (ref 22–32)
Calcium: 9.1 mg/dL (ref 8.9–10.3)
Chloride: 106 mmol/L (ref 98–111)
Creatinine, Ser: 1.11 mg/dL — ABNORMAL HIGH (ref 0.44–1.00)
GFR, Estimated: 57 mL/min — ABNORMAL LOW (ref >60)
Glucose, Bld: 128 mg/dL — ABNORMAL HIGH (ref 70–99)
Potassium: 3 mmol/L — ABNORMAL LOW (ref 3.5–5.1)
Sodium: 139 mmol/L (ref 135–145)
Total Bilirubin: 0.9 mg/dL (ref 0.3–1.2)
Total Protein: 9 g/dL — ABNORMAL HIGH (ref 6.5–8.1)

## 2022-10-12 LAB — LIPASE, BLOOD: Lipase: 37 U/L (ref 11–51)

## 2022-10-12 LAB — CBC WITH DIFFERENTIAL/PLATELET
Abs Immature Granulocytes: 0.08 10*3/uL — ABNORMAL HIGH (ref 0.00–0.07)
Basophils Absolute: 0 10*3/uL (ref 0.0–0.1)
Basophils Relative: 0 %
Eosinophils Absolute: 0 10*3/uL (ref 0.0–0.5)
Eosinophils Relative: 0 %
HCT: 41.4 % (ref 36.0–46.0)
Hemoglobin: 14.2 g/dL (ref 12.0–15.0)
Immature Granulocytes: 1 %
Lymphocytes Relative: 13 %
Lymphs Abs: 1.7 10*3/uL (ref 0.7–4.0)
MCH: 29.8 pg (ref 26.0–34.0)
MCHC: 34.3 g/dL (ref 30.0–36.0)
MCV: 87 fL (ref 80.0–100.0)
Monocytes Absolute: 0.8 10*3/uL (ref 0.1–1.0)
Monocytes Relative: 7 %
Neutro Abs: 10.1 10*3/uL — ABNORMAL HIGH (ref 1.7–7.7)
Neutrophils Relative %: 79 %
Platelets: 329 10*3/uL (ref 150–400)
RBC: 4.76 MIL/uL (ref 3.87–5.11)
RDW: 12.2 % (ref 11.5–15.5)
WBC: 12.7 10*3/uL — ABNORMAL HIGH (ref 4.0–10.5)
nRBC: 0 % (ref 0.0–0.2)

## 2022-10-12 MED ORDER — SODIUM CHLORIDE 0.9 % IV SOLN
25.0000 mg | Freq: Once | INTRAVENOUS | Status: DC
  Filled 2022-10-12: qty 1

## 2022-10-12 MED ORDER — AMOXICILLIN-POT CLAVULANATE 875-125 MG PO TABS
1.0000 | ORAL_TABLET | Freq: Two times a day (BID) | ORAL | 0 refills | Status: DC
  Filled 2022-10-12: qty 14, 7d supply, fill #0

## 2022-10-12 MED ORDER — FENTANYL CITRATE PF 50 MCG/ML IJ SOSY
50.0000 ug | PREFILLED_SYRINGE | Freq: Once | INTRAMUSCULAR | Status: AC
  Administered 2022-10-12: 50 ug via INTRAVENOUS
  Filled 2022-10-12: qty 1

## 2022-10-12 MED ORDER — HALOPERIDOL LACTATE 5 MG/ML IJ SOLN
5.0000 mg | Freq: Once | INTRAMUSCULAR | Status: AC
  Administered 2022-10-12: 5 mg via INTRAVENOUS
  Filled 2022-10-12: qty 1

## 2022-10-12 MED ORDER — FENTANYL CITRATE PF 50 MCG/ML IJ SOSY: 100.0000 ug | PREFILLED_SYRINGE | Freq: Once | INTRAMUSCULAR | Status: DC

## 2022-10-12 MED ORDER — PROMETHAZINE HCL 25 MG PO TABS
25.0000 mg | ORAL_TABLET | Freq: Four times a day (QID) | ORAL | 0 refills | Status: DC | PRN
  Filled 2022-10-12: qty 30, 8d supply, fill #0

## 2022-10-12 MED ORDER — SODIUM CHLORIDE 0.9 % IV BOLUS
1000.0000 mL | Freq: Once | INTRAVENOUS | Status: AC
  Administered 2022-10-12: 1000 mL via INTRAVENOUS

## 2022-10-12 MED ORDER — OXYCODONE HCL 5 MG PO TABS
5.0000 mg | ORAL_TABLET | Freq: Four times a day (QID) | ORAL | 0 refills | Status: DC | PRN
  Filled 2022-10-12: qty 10, 3d supply, fill #0

## 2022-10-12 MED ORDER — HEPARIN SOD (PORK) LOCK FLUSH 100 UNIT/ML IV SOLN
500.0000 [IU] | Freq: Once | INTRAVENOUS | Status: AC
  Administered 2022-10-12: 500 [IU]
  Filled 2022-10-12: qty 5

## 2022-10-12 NOTE — Progress Notes (Signed)
Arterial stick done for labs per MD

## 2022-10-12 NOTE — ED Provider Notes (Signed)
[facility] Provider Note   CSN: [id number] Arrival date & time: 10/12/22  [id number]     History  Chief Complaint  Patient presents with   Vomiting    Rachel Pitts is a 62 y.o. female.  Patient here with nausea and vomiting and diarrhea for the last few days.  History of chronic pancreatitis.  Denies any chest pain or shortness of breath or abdominal pain.  Pain mostly in the upper abdomen as prior.  Phenergan at home not helping much.  Denies any weakness or numbness or chills.  Denies any pain with urination.  The history is provided by the patient.       Home Medications Prior to Admission medications   Medication Sig Start Date End Date Taking? Authorizing Provider  amoxicillin-clavulanate (AUGMENTIN) 875-125 MG tablet Take 1 tablet by mouth every 12 (twelve) hours. 10/12/22  Yes Ariyan Sinnett, DO  oxyCODONE (ROXICODONE) 5 MG immediate release tablet Take 1 tablet (5 mg total) by mouth every 6 (six) hours as needed for up to 10 doses. 10/12/22  Yes Blayne Garlick, DO  promethazine (PHENERGAN) 25 MG tablet Take 1 tablet (25 mg total) by mouth every 6 (six) hours as needed for nausea or vomiting. 10/12/22  Yes Kelyse Pask, DO  atenolol (TENORMIN) 50 MG tablet Take 50 mg by mouth daily.    [provider]  gabapentin (NEURONTIN) 600 MG tablet Take 600 mg by mouth 4 (four) times daily.    [provider]  oxyCODONE-acetaminophen (PERCOCET) 5-325 MG tablet Take 1 tablet by mouth every 6 (six) hours as needed for up to 10 doses. 09/17/22   Kyesha Balla, DO  potassium chloride SA (KLOR-CON M) 20 MEQ tablet Take 1 tablet (20 mEq total) by mouth 2 (two) times daily for 4 days. 09/17/22 09/21/22  Shirely Toren, DO  tiZANidine (ZANAFLEX) 4 MG capsule Take 4 mg by mouth 4 (four) times daily.    [provider]      Allergies    Ivp dye [iodinated contrast media], Ketorolac, Morphine and related, and Tramadol     Review of Systems   Review of Systems  Physical Exam Updated Vital Signs BP (!) 183/106   Pulse 95   Temp 98.1 F (36.7 C) (Oral)   Resp (!) 24   Ht 5' (1.524 m)   Wt 47.6 kg   SpO2 99%   BMI 20.51 kg/m  Physical Exam Vitals and nursing note reviewed.  Constitutional:      General: She is not in acute distress.    Appearance: She is well-developed. She is not ill-appearing.  HENT:     Head: Normocephalic and atraumatic.     Nose: Nose normal.     Mouth/Throat:     Mouth: Mucous membranes are moist.  Eyes:     Extraocular Movements: Extraocular movements intact.     Conjunctiva/sclera: Conjunctivae normal.     Pupils: Pupils are equal, round, and reactive to light.  Cardiovascular:     Rate and Rhythm: Normal rate and regular rhythm.     Pulses: Normal pulses.     Heart sounds: No murmur heard. Pulmonary:     Effort: Pulmonary effort is normal. No respiratory distress.     Breath sounds: Normal breath sounds.  Abdominal:     Palpations: Abdomen is soft.     Tenderness: There is abdominal tenderness.  Musculoskeletal:        General: No swelling.  Cervical back: Normal range of motion and neck supple.  Skin:    General: Skin is warm and dry.     Capillary Refill: Capillary refill takes less than 2 seconds.  Neurological:     Mental Status: She is alert.  Psychiatric:        Mood and Affect: Mood normal.     ED Results / Procedures / Treatments   Labs (all labs ordered are listed, but only abnormal results are displayed) Labs Reviewed  CBC WITH DIFFERENTIAL/PLATELET - Abnormal; Notable for the following components:      Result Value   WBC 12.7 (*)    Neutro Abs 10.1 (*)    Abs Immature Granulocytes 0.08 (*)    All other components within normal limits  COMPREHENSIVE METABOLIC PANEL - Abnormal; Notable for the following components:   Potassium 3.0 (*)    Glucose, Bld 128 (*)    Creatinine, Ser 1.11 (*)    Total Protein 9.0 (*)    GFR, Estimated 57  (*)    All other components within normal limits  LIPASE, BLOOD    EKG EKG Interpretation  Date/Time:  Wednesday October 12 2022 08:54:20 EDT Ventricular Rate:  106 PR Interval:  100 QRS Duration: 78 QT Interval:  363 QTC Calculation: 482 R Axis:   80 Text Interpretation: Sinus tachycardia LAE, consider biatrial enlargement Minimal ST depression, inferior leads Confirmed by Lennice Sites ([id number]) on 10/12/2022 9:05:17 AM  Radiology CT ABDOMEN PELVIS WO CONTRAST  Result Date: 10/12/2022 CLINICAL DATA:  Abdominal pain, acute, nonlocalized. Chronic pancreatitis EXAM: CT ABDOMEN AND PELVIS WITHOUT CONTRAST TECHNIQUE: Multidetector CT imaging of the abdomen and pelvis was performed following the standard protocol without IV contrast. RADIATION DOSE REDUCTION: This exam was performed according to the departmental dose-optimization program which includes automated exposure control, adjustment of the mA and/or kV according to patient size and/or use of iterative reconstruction technique. COMPARISON:  09/17/2022 FINDINGS: Lower chest: Included lung bases are clear.  Heart size is normal. Hepatobiliary: No focal liver abnormality is seen. Status post cholecystectomy. No biliary dilatation. Pancreas: Unremarkable. No pancreatic ductal dilatation or surrounding inflammatory changes. Spleen: Normal in size without focal abnormality. Adrenals/Urinary Tract: Adrenal glands are unremarkable. Kidneys are normal, without renal calculi, focal lesion, or hydronephrosis. Bladder is unremarkable. Stomach/Bowel: Stomach within normal limits. No dilated loops of bowel. Mild colonic wall thickening in the rectosigmoid region (series 2, image 58). Normal appendix in the right lower quadrant (series 5, image 37). Vascular/Lymphatic: Scattered aortoiliac atherosclerotic calcifications without aneurysm. No abdominopelvic lymphadenopathy. Reproductive: Status post hysterectomy. No adnexal masses. Other: No free fluid. No  abdominopelvic fluid collection. No pneumoperitoneum. Musculoskeletal: No acute or significant osseous findings. IMPRESSION: 1. Mild colonic wall thickening in the rectosigmoid region, suggestive of an infectious or inflammatory colitis. 2. Otherwise, no acute abdominopelvic findings. 3. Aortic atherosclerosis (ICD10-I70.0). Electronically Signed   By: Davina Poke D.O.   On: 10/12/2022 10:20    Procedures Procedures    Medications Ordered in ED Medications  fentaNYL (SUBLIMAZE) injection 50 mcg (has no administration in time range)  sodium chloride 0.9 % bolus 1,000 mL (1,000 mLs Intravenous New Bag/Given 10/12/22 0956)  fentaNYL (SUBLIMAZE) injection 50 mcg (50 mcg Intravenous Given 10/12/22 0946)  haloperidol lactate (HALDOL) injection 5 mg (5 mg Intravenous Given 10/12/22 [id number])    ED Course/ Medical Decision Making/ A&P  Medical Decision Making Amount and/or Complexity of Data Reviewed Labs: ordered. Radiology: ordered.  Risk Prescription drug management.   Aamori Eichorn is here with abdominal pain and nausea and vomiting.  History of chronic pancreatitis.  Patient arrives with high blood pressure but otherwise normal vitals.  Differential diagnosis possibly acute on chronic pain flare, pancreatitis, less likely bowel obstruction or UTI.  She has EKG that shows sinus rhythm.  No ischemic changes.  Am not concerned about any cardiac or pulmonary problem.  She is having mostly GI symptoms with poor ability to keep p.o.  Will get CBC, CMP and lipase and give IV fluids, IV Phenergan and IV fentanyl and CT scan abdomen and pelvis.  Per my review and interpretation labs no significant anemia or electrolyte abnormality or kidney injury or leukocytosis.  CT scan per radiology report shows may be some mild colitis of the rectosigmoid region.  Will treat with Augmentin.  She is feeling much better after IV fluids and IV pain medicine.  Will give pain medicine and  antiemetics for home.  Discharged in good condition.  Understands return precautions.  This chart was dictated using voice recognition software.  Despite best efforts to proofread,  errors can occur which can change the documentation meaning.         Final Clinical Impression(s) / ED Diagnoses Final diagnoses:  None    Rx / DC Orders ED Discharge Orders          Ordered    amoxicillin-clavulanate (AUGMENTIN) 875-125 MG tablet  Every 12 hours        10/12/22 1026    oxyCODONE (ROXICODONE) 5 MG immediate release tablet  Every 6 hours PRN        10/12/22 1026    promethazine (PHENERGAN) 25 MG tablet  Every 6 hours PRN        10/12/22 1026              Easten Maceachern, DO 10/12/22 1109

## 2022-10-12 NOTE — ED Triage Notes (Signed)
Pt coming in with hx of chronic pancreatitis. Pt reports having N/V/D x4 days. Pt states she's having upper abdominal pain, primarily on the right side which is consistent with when her pancreatitis flare up. Pt reports having a port in place for when she gets fluids/bloodwork/medications in hospital for the pancreatitis. Pt reports feeling lightheaded and SOB with exertion.

## 2022-11-14 ENCOUNTER — Encounter (HOSPITAL_BASED_OUTPATIENT_CLINIC_OR_DEPARTMENT_OTHER): Payer: Self-pay | Admitting: Urology

## 2022-11-14 ENCOUNTER — Emergency Department (HOSPITAL_BASED_OUTPATIENT_CLINIC_OR_DEPARTMENT_OTHER): Payer: 59

## 2022-11-14 ENCOUNTER — Inpatient Hospital Stay (HOSPITAL_BASED_OUTPATIENT_CLINIC_OR_DEPARTMENT_OTHER)
Admission: EM | Admit: 2022-11-14 | Discharge: 2022-11-18 | DRG: 439 | Disposition: A | Discharge status: Home / Self Care | Payer: 59 | Attending: Internal Medicine | Admitting: Internal Medicine

## 2022-11-14 ENCOUNTER — Other Ambulatory Visit: Payer: Self-pay

## 2022-11-14 DIAGNOSIS — K529 Noninfective gastroenteritis and colitis, unspecified: Secondary | ICD-10-CM | POA: Diagnosis present

## 2022-11-14 DIAGNOSIS — N179 Acute kidney failure, unspecified: Secondary | ICD-10-CM | POA: Diagnosis not present

## 2022-11-14 DIAGNOSIS — E876 Hypokalemia: Secondary | ICD-10-CM | POA: Diagnosis not present

## 2022-11-14 DIAGNOSIS — E861 Hypovolemia: Secondary | ICD-10-CM

## 2022-11-14 DIAGNOSIS — E860 Dehydration: Secondary | ICD-10-CM | POA: Diagnosis present

## 2022-11-14 DIAGNOSIS — Z91041 Radiographic dye allergy status: Secondary | ICD-10-CM

## 2022-11-14 DIAGNOSIS — Z79899 Other long term (current) drug therapy: Secondary | ICD-10-CM

## 2022-11-14 DIAGNOSIS — I959 Hypotension, unspecified: Secondary | ICD-10-CM

## 2022-11-14 DIAGNOSIS — G4700 Insomnia, unspecified: Secondary | ICD-10-CM

## 2022-11-14 DIAGNOSIS — Z888 Allergy status to other drugs, medicaments and biological substances status: Secondary | ICD-10-CM

## 2022-11-14 DIAGNOSIS — K861 Other chronic pancreatitis: Secondary | ICD-10-CM | POA: Diagnosis present

## 2022-11-14 DIAGNOSIS — I471 Supraventricular tachycardia, unspecified: Secondary | ICD-10-CM | POA: Diagnosis present

## 2022-11-14 DIAGNOSIS — I10 Essential (primary) hypertension: Secondary | ICD-10-CM | POA: Diagnosis present

## 2022-11-14 DIAGNOSIS — Z885 Allergy status to narcotic agent status: Secondary | ICD-10-CM

## 2022-11-14 DIAGNOSIS — G8929 Other chronic pain: Secondary | ICD-10-CM | POA: Diagnosis present

## 2022-11-14 DIAGNOSIS — R112 Nausea with vomiting, unspecified: Secondary | ICD-10-CM | POA: Diagnosis present

## 2022-11-14 DIAGNOSIS — K859 Acute pancreatitis without necrosis or infection, unspecified: Secondary | ICD-10-CM | POA: Diagnosis not present

## 2022-11-14 DIAGNOSIS — Z886 Allergy status to analgesic agent status: Secondary | ICD-10-CM

## 2022-11-14 DIAGNOSIS — K219 Gastro-esophageal reflux disease without esophagitis: Secondary | ICD-10-CM | POA: Diagnosis present

## 2022-11-14 LAB — URINALYSIS, ROUTINE W REFLEX MICROSCOPIC
Bilirubin Urine: NEGATIVE
Glucose, UA: NEGATIVE mg/dL
Ketones, ur: NEGATIVE mg/dL
Leukocytes,Ua: NEGATIVE
Nitrite: NEGATIVE
Protein, ur: NEGATIVE mg/dL
Specific Gravity, Urine: >=1.03 (ref 1.005–1.030)
pH: 7 (ref 5.0–8.0)

## 2022-11-14 LAB — CBC
HCT: 41.2 % (ref 36.0–46.0)
Hemoglobin: 14.1 g/dL (ref 12.0–15.0)
MCH: 30.1 pg (ref 26.0–34.0)
MCHC: 34.2 g/dL (ref 30.0–36.0)
MCV: 88 fL (ref 80.0–100.0)
Platelets: 303 10*3/uL (ref 150–400)
RBC: 4.68 MIL/uL (ref 3.87–5.11)
RDW: 12.5 % (ref 11.5–15.5)
WBC: 19.1 10*3/uL — ABNORMAL HIGH (ref 4.0–10.5)
nRBC: 0 % (ref 0.0–0.2)

## 2022-11-14 LAB — COMPREHENSIVE METABOLIC PANEL
ALT: 12 U/L (ref 0–44)
AST: 22 U/L (ref 15–41)
Albumin: 4.3 g/dL (ref 3.5–5.0)
Alkaline Phosphatase: 92 U/L (ref 38–126)
Anion gap: 15 (ref 5–15)
BUN: 17 mg/dL (ref 8–23)
CO2: 19 mmol/L — ABNORMAL LOW (ref 22–32)
Calcium: 9.1 mg/dL (ref 8.9–10.3)
Chloride: 101 mmol/L (ref 98–111)
Creatinine, Ser: 1.01 mg/dL — ABNORMAL HIGH (ref 0.44–1.00)
GFR, Estimated: >60 mL/min (ref >60)
Glucose, Bld: 115 mg/dL — ABNORMAL HIGH (ref 70–99)
Potassium: 2.7 mmol/L — CL (ref 3.5–5.1)
Sodium: 135 mmol/L (ref 135–145)
Total Bilirubin: 0.8 mg/dL (ref 0.3–1.2)
Total Protein: 8.4 g/dL — ABNORMAL HIGH (ref 6.5–8.1)

## 2022-11-14 LAB — LIPASE, BLOOD: Lipase: 495 U/L — ABNORMAL HIGH (ref 11–51)

## 2022-11-14 LAB — URINALYSIS, MICROSCOPIC (REFLEX)

## 2022-11-14 LAB — MAGNESIUM: Magnesium: 1.5 mg/dL — ABNORMAL LOW (ref 1.7–2.4)

## 2022-11-14 MED ORDER — LACTATED RINGERS IV BOLUS
1000.0000 mL | Freq: Once | INTRAVENOUS | Status: AC
  Administered 2022-11-14: 1000 mL via INTRAVENOUS

## 2022-11-14 MED ORDER — HYDROMORPHONE HCL 1 MG/ML IJ SOLN
1.0000 mg | Freq: Once | INTRAMUSCULAR | Status: AC
  Administered 2022-11-14: 1 mg via INTRAVENOUS
  Filled 2022-11-14: qty 1

## 2022-11-14 MED ORDER — SODIUM CHLORIDE 0.9 % IV SOLN: INTRAVENOUS | Status: DC | PRN

## 2022-11-14 MED ORDER — MAGNESIUM OXIDE -MG SUPPLEMENT 400 (240 MG) MG PO TABS
800.0000 mg | ORAL_TABLET | Freq: Once | ORAL | Status: AC
  Administered 2022-11-14: 800 mg via ORAL
  Filled 2022-11-14: qty 2

## 2022-11-14 MED ORDER — HYDROMORPHONE HCL 1 MG/ML IJ SOLN: 1.0000 mg | Freq: Once | INTRAMUSCULAR | Status: DC

## 2022-11-14 MED ORDER — POTASSIUM CHLORIDE 10 MEQ/100ML IV SOLN
10.0000 meq | INTRAVENOUS | Status: AC
  Administered 2022-11-14 (×3): 10 meq via INTRAVENOUS
  Filled 2022-11-14 (×3): qty 100

## 2022-11-14 MED ORDER — LACTATED RINGERS IV SOLN: INTRAVENOUS | Status: DC

## 2022-11-14 MED ORDER — METOCLOPRAMIDE HCL 5 MG/ML IJ SOLN
10.0000 mg | Freq: Once | INTRAMUSCULAR | Status: AC
  Administered 2022-11-14: 10 mg via INTRAVENOUS
  Filled 2022-11-14: qty 2

## 2022-11-14 MED ORDER — ONDANSETRON HCL 4 MG/2ML IJ SOLN
4.0000 mg | Freq: Once | INTRAMUSCULAR | Status: AC
  Administered 2022-11-14: 4 mg via INTRAVENOUS
  Filled 2022-11-14: qty 2

## 2022-11-14 MED ORDER — FENTANYL CITRATE PF 50 MCG/ML IJ SOSY
50.0000 ug | PREFILLED_SYRINGE | Freq: Once | INTRAMUSCULAR | Status: AC
  Administered 2022-11-14: 50 ug via INTRAVENOUS
  Filled 2022-11-14: qty 1

## 2022-11-14 MED ORDER — POTASSIUM CHLORIDE CRYS ER 20 MEQ PO TBCR
60.0000 meq | EXTENDED_RELEASE_TABLET | Freq: Once | ORAL | Status: AC
  Administered 2022-11-14: 60 meq via ORAL
  Filled 2022-11-14: qty 3

## 2022-11-14 MED ORDER — ENOXAPARIN SODIUM 40 MG/0.4ML IJ SOSY
40.0000 mg | PREFILLED_SYRINGE | INTRAMUSCULAR | Status: DC
  Administered 2022-11-15 – 2022-11-18 (×4): 40 mg via SUBCUTANEOUS
  Filled 2022-11-14 (×4): qty 0.4

## 2022-11-14 MED ORDER — SODIUM CHLORIDE 0.9 % IV BOLUS
1000.0000 mL | Freq: Once | INTRAVENOUS | Status: AC
  Administered 2022-11-14: 1000 mL via INTRAVENOUS

## 2022-11-14 NOTE — ED Notes (Signed)
ED TO INPATIENT HANDOFF REPORT  ED Nurse Name and Phone #: Joseph Art [phone]  S Name/Age/Gender Rachel Pitts 61 y.o. female Room/Bed: MH08/MH08  Code Status   Code Status: Not on file  Home/SNF/Other Home Patient oriented to: self, place, time, and situation Is this baseline? Yes   Triage Complete: Triage complete  Chief Complaint Acute pancreatitis [K85.90]  Triage Note Pt states RUQ pain that started 4 days ago  N/V , weakness, reports not tolerating any po intake  Denies fever    Allergies Allergies  Allergen Reactions   Ivp Dye [Iodinated Contrast Media] Shortness Of Breath   Ketorolac Nausea And Vomiting   Morphine And Related Nausea And Vomiting   Tramadol Nausea And Vomiting    Level of Care/Admitting Diagnosis ED Disposition     ED Disposition  Admit   Condition  --   Comment  Hospital Area: [facility] [[id number]]  Level of Care: Med-Surg [16]  Interfacility transfer: Yes  May place patient in observation at [facility] or [facility] if equivalent level of care is available:: Yes  Covid Evaluation: Asymptomatic - no recent exposure (last 10 days) testing not required  Diagnosis: Acute pancreatitis [577.0.ICD-9-CM]  Admitting Physician: Anselm Jungling [[id number]]  Attending Physician: Anselm Jungling [[id number]]          B Medical/Surgery History Past Medical History:  Diagnosis Date   Cancer    cervical   GERD (gastroesophageal reflux disease)    Hypertension    Irregular heart rate    Pancreatitis    Past Surgical History:  Procedure Laterality Date   ABDOMINAL HYSTERECTOMY     ABDOMINAL SURGERY     CHOLECYSTECTOMY     implanted heart monitor       A IV Location/Drains/Wounds Patient Lines/Drains/Airways Status     Active Line/Drains/Airways     Name Placement date Placement time Site Days   Implanted Port Left Chest --  --  Chest  --   Peripheral IV 11/14/22 18 G Anterior;Left;Upper Arm 11/14/22  1328  Arm   less than 1            Intake/Output Last 24 hours  Intake/Output Summary (Last 24 hours) at 11/14/2022 2029 Last data filed at 11/14/2022 1651 Gross per 24 hour  Intake 1001.41 ml  Output --  Net 1001.41 ml    Labs/Imaging Results for orders placed or performed during the hospital encounter of 11/14/22 (from the past 48 hour(s))  Lipase, blood     Status: Abnormal   Collection Time: 11/14/22  1:28 PM  Result Value Ref Range   Lipase 495 (H) 11 - 51 U/L    Comment: RESULTS CONFIRMED BY MANUAL DILUTION Performed at [facility], [address], [place], [place] [address]   Comprehensive metabolic panel     Status: Abnormal   Collection Time: 11/14/22  1:28 PM  Result Value Ref Range   Sodium 135 135 - 145 mmol/L   Potassium 2.7 (LL) 3.5 - 5.1 mmol/L    Comment: CRITICAL RESULT CALLED TO, READ BACK BY AND VERIFIED WITH A NOAH RN 11/14/22 1700 MB   Chloride 101 98 - 111 mmol/L   CO2 19 (L) 22 - 32 mmol/L   Glucose, Bld 115 (H) 70 - 99 mg/dL    Comment: Glucose reference range applies only to samples taken after fasting for at least 8 hours.   BUN 17 8 - 23 mg/dL   Creatinine, Ser 6.57 (H) 0.44 -  1.00 mg/dL   Calcium 9.1 8.9 - 27.2 mg/dL   Total Protein 8.4 (H) 6.5 - 8.1 g/dL   Albumin 4.3 3.5 - 5.0 g/dL   AST 22 15 - 41 U/L   ALT 12 0 - 44 U/L   Alkaline Phosphatase 92 38 - 126 U/L   Total Bilirubin 0.8 0.3 - 1.2 mg/dL   GFR, Estimated >53 >66 mL/min    Comment: (NOTE) Calculated using the CKD-EPI Creatinine Equation (2021)    Anion gap 15 5 - 15    Comment: Performed at [facility], [address] [place], [place] [address]  CBC     Status: Abnormal   Collection Time: 11/14/22  1:28 PM  Result Value Ref Range   WBC 19.1 (H) 4.0 - 10.5 K/uL   RBC 4.68 3.87 - 5.11 MIL/uL   Hemoglobin 14.1 12.0 - 15.0 g/dL   HCT 74.2 59.5 - 63.8 %   MCV 88.0 80.0 - 100.0 fL   MCH 30.1 26.0 - 34.0 pg   MCHC 34.2 30.0 - 36.0 g/dL   RDW 75.6 43.3 -  29.5 %   Platelets 303 150 - 400 K/uL   nRBC 0.0 0.0 - 0.2 %    Comment: Performed at [facility], [address] [place], [place] [address]  Urinalysis, Routine w reflex microscopic -Urine, Clean Catch     Status: Abnormal   Collection Time: 11/14/22  4:12 PM  Result Value Ref Range   Color, Urine STRAW (A) YELLOW   APPearance CLEAR CLEAR   Specific Gravity, Urine >=1.030 1.005 - 1.030   pH 7.0 5.0 - 8.0   Glucose, UA NEGATIVE NEGATIVE mg/dL   Hgb urine dipstick TRACE (A) NEGATIVE   Bilirubin Urine NEGATIVE NEGATIVE   Ketones, ur NEGATIVE NEGATIVE mg/dL   Protein, ur NEGATIVE NEGATIVE mg/dL   Nitrite NEGATIVE NEGATIVE   Leukocytes,Ua NEGATIVE NEGATIVE    Comment: Performed at [facility], [address] [place], [place] [address]  Urinalysis, Microscopic (reflex)     Status: Abnormal   Collection Time: 11/14/22  4:12 PM  Result Value Ref Range   RBC / HPF 0-5 0 - 5 RBC/hpf   WBC, UA 0-5 0 - 5 WBC/hpf   Bacteria, UA RARE (A) NONE SEEN   Squamous Epithelial / HPF 0-5 0 - 5 /HPF    Comment: Performed at [facility], [address] [place], [place] [address]  Magnesium     Status: Abnormal   Collection Time: 11/14/22  5:24 PM  Result Value Ref Range   Magnesium 1.5 (L) 1.7 - 2.4 mg/dL    Comment: Performed at [facility] [address] [place], [place] [address]   CT ABDOMEN PELVIS WO CONTRAST  Result Date: 11/14/2022 CLINICAL DATA:  Abdominal pain, acute, nonlocalized. Chronic pancreatitis. Recent episode of colitis. EXAM: CT ABDOMEN AND PELVIS WITHOUT CONTRAST TECHNIQUE: Multidetector CT imaging of the abdomen and pelvis was performed following the standard protocol without IV contrast. RADIATION DOSE REDUCTION: This exam was performed according to the departmental dose-optimization program which includes automated exposure control, adjustment of the mA and/or kV according to patient size and/or use of iterative reconstruction  technique. COMPARISON:  10/20/2022 FINDINGS: Lower chest: Lung bases are clear. Hepatobiliary: Liver parenchyma is normal without contrast. Previous cholecystectomy. Pancreas: Recurrent pancreatitis of the pancreatic head region with surrounding regional edema. The differential diagnosis does include gastritis/duodenitis or ulcer disease. Spleen:  Normal Adrenals/Urinary Tract: Adrenal glands are normal. Kidneys are normal. No stone or hydronephrosis. Bladder is full but normal. Stomach/Bowel: Majority of the stomach appears normal. See above discussion regarding pancreatitis of the head region versus duodenitis/ulcer disease. Beyond that, the small bowel is normal. Normal appendix. Normal colon. No sign of colitis as was question previously. Vascular/Lymphatic: Aortic atherosclerosis. No aneurysm. IVC is normal. No adenopathy. Reproductive: Previous hysterectomy.  No pelvic mass. Other: No free fluid or air. Musculoskeletal: No significant musculoskeletal finding. IMPRESSION: 1. Abnormal edema in the region of the pancreatic head and duodenal C loop. The differential diagnosis is that of acute pancreatitis versus antritis/duodenitis/ulcer disease. No free air. 2. Previous cholecystectomy and hysterectomy. 3. Aortic atherosclerosis. Aortic Atherosclerosis (ICD10-I70.0). Electronically Signed   By: Paulina Fusi M.D.   On: 11/14/2022 16:03    Pending Labs Unresulted Labs (From admission, onward)    None       Vitals/Pain Today's Vitals   11/14/22 1700 11/14/22 1715 11/14/22 1830 11/14/22 1845  BP: (!) 178/100 (!) 177/98 (!) 182/97 (!) 178/101  Pulse: 94 98 (!) 109 99  Resp:  (!) 22 (!) 27 20  Temp:  98 F (36.7 C)    TempSrc:      SpO2: 91% 92% 99% 99%  Weight:      Height:      PainSc:        Isolation Precautions No active isolations  Medications Medications  potassium chloride 10 mEq in 100 mL IVPB (10 mEq Intravenous New Bag/Given 11/14/22 2003)  0.9 %  sodium chloride infusion (  Intravenous New Bag/Given 11/14/22 1750)  HYDROmorphone (DILAUDID) injection 1 mg (1 mg Intravenous Given 11/14/22 1520)  ondansetron (ZOFRAN) injection 4 mg (4 mg Intravenous Given 11/14/22 1520)  lactated ringers bolus 1,000 mL ( Intravenous Stopped 11/14/22 1650)  HYDROmorphone (DILAUDID) injection 1 mg (1 mg Intravenous Given 11/14/22 1620)  metoCLOPramide (REGLAN) injection 10 mg (10 mg Intravenous Given 11/14/22 1622)  potassium chloride SA (KLOR-CON M) CR tablet 60 mEq (60 mEq Oral Given 11/14/22 1805)  fentaNYL (SUBLIMAZE) injection 50 mcg (50 mcg Intravenous Given 11/14/22 1750)  magnesium oxide (MAG-OX) tablet 800 mg (800 mg Oral Given 11/14/22 1956)  HYDROmorphone (DILAUDID) injection 1 mg (1 mg Intravenous Given 11/14/22 1949)    Mobility walks     Focused Assessments Pt A+O X 4, Skin OK, up to restroom without asst, LBM today.    R Recommendations: See Admitting Provider Note  Report given to:   Additional Notes: Family at bedside, 18 in left upper arm locked, port in left chest with saline @ 100 and K @ 100.

## 2022-11-14 NOTE — Progress Notes (Signed)
Plan of Care Note for accepted transfer   Patient: Rachel Pitts MRN: [id number]   DOA: 11/14/2022  Facility requesting transfer: [facility] ED2 Requesting Provider: Georgeanna Lea, PA Reason for transfer: Acute pancreatitis Facility course: 61 yo F with HTN, SVT, chronic abdominal pain presented with worsening epigastric pain, N/V for 4 days.   Lipase elevated to >400.   CT abd/pelvis wo contrast with abnormal edema in the region of the pancreatic head and duodenal C loop. The differential diagnosis is that of acute pancreatitis versus antritis/duodenitis/ulcer disease. No free air.   Also has hypokalemia 2.7 and AKI.   Has received fluids, pain meds and potassium repletion.   Plan of care: The patient is accepted for admission to Med-surg  unit, at [facility] or [facility] depending on bed availability.  ED PA to continue care of patient while she remains in ED.   Author: Anselm Jungling, DO 11/14/2022  Check [email or web address] for on-call coverage.  Nursing staff, Please call [facility] Admits & Consults System-Wide number on Amion as soon as patient's arrival, so appropriate admitting provider can evaluate the pt.

## 2022-11-14 NOTE — ED Notes (Signed)
Pt made aware for need of urine sample , pt stated she couldn't give one at this time. Will try later .

## 2022-11-14 NOTE — ED Notes (Signed)
Patient transported to X-ray 

## 2022-11-14 NOTE — ED Notes (Signed)
Lipase redrawn and sent to lab

## 2022-11-14 NOTE — ED Provider Notes (Signed)
EMERGENCY DEPARTMENT AT [facility] Provider Note   CSN: [id number] Arrival date & time: 11/14/22  1315     History  Chief Complaint  Patient presents with   Abdominal Pain    Rachel Pitts is a 62 y.o. female past medical history of cholecystectomy and recurrent pancreatitis presenting today with concern for pancreatitis flare.  She says that she has chronic epigastric pain that has not gone away since her last visit to the emergency department a month ago but over the past couple of days it has been worse.  She is been nauseated and vomiting.  Also endorsing some diarrhea.  Patient has not recently seen gastroenterology.  She used to be established with somebody who moved to [facility] and she was trying to get a referral back to see him.  Denies alcohol use.  She does not feel like this was flared by any or drink   Abdominal Pain Associated symptoms: diarrhea, nausea and vomiting   Associated symptoms: no chills and no fever        Home Medications Prior to Admission medications   Medication Sig Start Date End Date Taking? Authorizing Provider  amoxicillin-clavulanate (AUGMENTIN) 875-125 MG tablet Take 1 tablet by mouth every 12 (twelve) hours. 10/12/22   Curatolo, Adam, DO  atenolol (TENORMIN) 50 MG tablet Take 50 mg by mouth daily.    [provider]  gabapentin (NEURONTIN) 600 MG tablet Take 600 mg by mouth 4 (four) times daily.    [provider]  oxyCODONE (ROXICODONE) 5 MG immediate release tablet Take 1 tablet (5 mg total) by mouth every 6 (six) hours as needed for up to 10 doses. 10/12/22   Curatolo, Adam, DO  oxyCODONE-acetaminophen (PERCOCET) 5-325 MG tablet Take 1 tablet by mouth every 6 (six) hours as needed for up to 10 doses. 09/17/22   Curatolo, Adam, DO  potassium chloride SA (KLOR-CON M) 20 MEQ tablet Take 1 tablet (20 mEq total) by mouth 2 (two) times daily for 4 days. 09/17/22 09/21/22  Virgina Norfolk, DO   promethazine (PHENERGAN) 25 MG tablet Take 1 tablet (25 mg total) by mouth every 6 (six) hours as needed for nausea or vomiting. 10/12/22   Curatolo, Adam, DO  tiZANidine (ZANAFLEX) 4 MG capsule Take 4 mg by mouth 4 (four) times daily.    [provider]      Allergies    Ivp dye [iodinated contrast media], Ketorolac, Morphine and related, and Tramadol    Review of Systems   Review of Systems  Constitutional:  Positive for appetite change. Negative for chills and fever.  Gastrointestinal:  Positive for abdominal pain, diarrhea, nausea and vomiting.  Musculoskeletal:  Positive for myalgias.    Physical Exam Updated Vital Signs BP 114/75   Pulse 71   Temp 98 F (36.7 C) (Oral)   Resp (!) 22   Ht 5' (1.524 m)   Wt 47.6 kg   SpO2 100%   BMI 20.49 kg/m  Physical Exam Vitals and nursing note reviewed.  Constitutional:      General: She is not in acute distress.    Appearance: Normal appearance. She is not ill-appearing.  HENT:     Head: Normocephalic and atraumatic.  Eyes:     General: No scleral icterus.    Conjunctiva/sclera: Conjunctivae normal.  Pulmonary:     Effort: Pulmonary effort is normal. No respiratory distress.  Abdominal:     General: Abdomen is flat.     Palpations:  Abdomen is soft.     Tenderness: There is abdominal tenderness in the epigastric area.     Hernia: No hernia is present.  Skin:    Findings: No rash.  Neurological:     Mental Status: She is alert.  Psychiatric:        Mood and Affect: Mood normal.     ED Results / Procedures / Treatments   Labs (all labs ordered are listed, but only abnormal results are displayed) Labs Reviewed  LIPASE, BLOOD - Abnormal; Notable for the following components:      Result Value   Lipase 495 (*)    All other components within normal limits  COMPREHENSIVE METABOLIC PANEL - Abnormal; Notable for the following components:   Potassium 2.7 (*)    CO2 19 (*)    Glucose, Bld 115 (*)    Creatinine,  Ser 1.01 (*)    Total Protein 8.4 (*)    All other components within normal limits  CBC - Abnormal; Notable for the following components:   WBC 19.1 (*)    All other components within normal limits  URINALYSIS, ROUTINE W REFLEX MICROSCOPIC - Abnormal; Notable for the following components:   Color, Urine STRAW (*)    Hgb urine dipstick TRACE (*)    All other components within normal limits  URINALYSIS, MICROSCOPIC (REFLEX) - Abnormal; Notable for the following components:   Bacteria, UA RARE (*)    All other components within normal limits  MAGNESIUM - Abnormal; Notable for the following components:   Magnesium 1.5 (*)    All other components within normal limits    EKG None  Radiology CT ABDOMEN PELVIS WO CONTRAST  Result Date: 11/14/2022 CLINICAL DATA:  Abdominal pain, acute, nonlocalized. Chronic pancreatitis. Recent episode of colitis. EXAM: CT ABDOMEN AND PELVIS WITHOUT CONTRAST TECHNIQUE: Multidetector CT imaging of the abdomen and pelvis was performed following the standard protocol without IV contrast. RADIATION DOSE REDUCTION: This exam was performed according to the departmental dose-optimization program which includes automated exposure control, adjustment of the mA and/or kV according to patient size and/or use of iterative reconstruction technique. COMPARISON:  10/20/2022 FINDINGS: Lower chest: Lung bases are clear. Hepatobiliary: Liver parenchyma is normal without contrast. Previous cholecystectomy. Pancreas: Recurrent pancreatitis of the pancreatic head region with surrounding regional edema. The differential diagnosis does include gastritis/duodenitis or ulcer disease. Spleen: Normal Adrenals/Urinary Tract: Adrenal glands are normal. Kidneys are normal. No stone or hydronephrosis. Bladder is full but normal. Stomach/Bowel: Majority of the stomach appears normal. See above discussion regarding pancreatitis of the head region versus duodenitis/ulcer disease. Beyond that, the  small bowel is normal. Normal appendix. Normal colon. No sign of colitis as was question previously. Vascular/Lymphatic: Aortic atherosclerosis. No aneurysm. IVC is normal. No adenopathy. Reproductive: Previous hysterectomy.  No pelvic mass. Other: No free fluid or air. Musculoskeletal: No significant musculoskeletal finding. IMPRESSION: 1. Abnormal edema in the region of the pancreatic head and duodenal C loop. The differential diagnosis is that of acute pancreatitis versus antritis/duodenitis/ulcer disease. No free air. 2. Previous cholecystectomy and hysterectomy. 3. Aortic atherosclerosis. Aortic Atherosclerosis (ICD10-I70.0). Electronically Signed   By: Paulina Fusi M.D.   On: 11/14/2022 16:03    Procedures Procedures   Medications Ordered in ED Medications  potassium chloride 10 mEq in 100 mL IVPB (10 mEq Intravenous New Bag/Given 11/14/22 1755)  0.9 %  sodium chloride infusion ( Intravenous New Bag/Given 11/14/22 1750)  magnesium oxide (MAG-OX) tablet 800 mg (has no administration in time range)  HYDROmorphone (DILAUDID) injection 1 mg (1 mg Intravenous Given 11/14/22 1520)  ondansetron (ZOFRAN) injection 4 mg (4 mg Intravenous Given 11/14/22 1520)  lactated ringers bolus 1,000 mL ( Intravenous Stopped 11/14/22 1650)  HYDROmorphone (DILAUDID) injection 1 mg (1 mg Intravenous Given 11/14/22 1620)  metoCLOPramide (REGLAN) injection 10 mg (10 mg Intravenous Given 11/14/22 1622)  potassium chloride SA (KLOR-CON M) CR tablet 60 mEq (60 mEq Oral Given 11/14/22 1805)  fentaNYL (SUBLIMAZE) injection 50 mcg (50 mcg Intravenous Given 11/14/22 1750)    ED Course/ Medical Decision Making/ A&P                             Medical Decision Making Amount and/or Complexity of Data Reviewed Labs: ordered. Radiology: ordered.  Risk OTC drugs. Prescription drug management. Decision regarding hospitalization.   62 year old female presenting today with abdominal. The differential diagnosis for generalized  abdominal pain includes, but is not limited to AAA, gastroenteritis, appendicitis, Bowel obstruction, Bowel perforation. Gastroparesis, DKA, Hernia, Inflammatory bowel disease, mesenteric ischemia, pancreatitis, peritonitis SBP, volvulus.   Past Medical History / Co-morbidities / Social History: Cholecystectomy, recurrent pancreatitis   Additional history: Per chart review patient was seen twice earlier this year for the same complaint.  Diagnosed with colitis   Physical Exam: Pertinent physical exam findings include Epigastric and upper quadrant tenderness  Lab Tests: I ordered, and personally interpreted labs.  The pertinent results include: WBC 19.1 Lipase 495 Potassium 2.7, Mg 1.5   Imaging Studies: I ordered and independently visualized and interpreted CT abdomen pelvis and I agree with the radiologist that patient has questionable findings of pancreatitis.  This is a noncontrasted CT however clinically this is likely.   Medications: Fluids, Dilaudid and Zofran only give the patient very mild improvement of her symptoms.  Fentanyl with mild improvement.    MDM/Disposition: This is a 62 year old female who presented today with epigastric pain.  She says that it felt like previous flares of her pancreatitis.  She has not been tolerating p.o. intake and also endorsing some diarrhea over the past couple of days but today it became intolerable.  Workup revealing of a WBC count of 19.1.  Potassium 2.7 as well.  Lipase 495.  At this time patient will be admitted for pancreatitis and hypokalemia.  Not tolerating p.o. intake and no improvement of her pain with Dilaudid and fentanyl.  She is agreeable.    Dr. Cyndia Bent will admit.  Final Clinical Impression(s) / ED Diagnoses Final diagnoses:  Hypokalemia  Acute pancreatitis, unspecified complication status, unspecified pancreatitis type    Rx / DC Orders ED Discharge Orders     None        Saddie Benders, PA-C 11/14/22  1851    Glynn Octave, MD 11/15/22 0830

## 2022-11-14 NOTE — ED Notes (Signed)
Pt has implanted port left chest. Accessed without difficulty . No blood return. Pt reports no blood return last time it was accessed.

## 2022-11-14 NOTE — ED Notes (Signed)
[facility] arrived Patient was asleep. When greeted by [facility] RN patient reported pain of 10/10, EDP made aware and ordered pain medication dependent on Patient vital signs. Blood pressure was low, Medication on hold and EDP ordered bolus of NS .9%

## 2022-11-14 NOTE — ED Triage Notes (Signed)
Pt states RUQ pain that started 4 days ago  N/V , weakness, reports not tolerating any po intake  Denies fever

## 2022-11-14 NOTE — ED Notes (Signed)
Called [facility] for transport at 7:45 pm and spoke to [place].

## 2022-11-14 NOTE — H&P (Addendum)
History and Physical    Patient: Rachel Pitts ZOX:[id number] DOB: 1961/07/11 DOA: 11/14/2022 DOS: the patient was seen and examined on 11/15/2022 PCP: [facility]  Patient coming from:  [facility] ED  Chief Complaint:  Chief Complaint  Patient presents with   Abdominal Pain   HPI: Kaelah Hayashi is a 62 y.o. female with medical history significant of with HTN, SVT, chronic abdominal pain, chronic pancreatitis presented with worsening epigastric pain, N/V for 2 days.   Pain is in epigastric radiation to RUQ. Has been constant with nausea and vomiting.No diarrhea. Not able to keep anything down. Denies NSAIDS or alcohol use. Reports a hx of chronic pancreatitis but has not been hospitalized for it in years. Remote abdominal surgery.   In the ED, normotensive with elevated BP.    Leukocytosis of 19K.  Lipase elevated to >400. Normal LFT.    CT abd/pelvis wo contrast with abnormal edema in the region of the pancreatic head and duodenal C loop. No free air.    Also has hypokalemia 2.7, Hypomagnesia of 1.5 and AKI Cr up from 0.78 to 1.01.    Has received IV fluids, pain meds and potassium repletion.     Review of Systems: As mentioned in the history of present illness. All other systems reviewed and are negative. Past Medical History:  Diagnosis Date   Cancer    cervical   GERD (gastroesophageal reflux disease)    Hypertension    Irregular heart rate    Pancreatitis    Past Surgical History:  Procedure Laterality Date   ABDOMINAL HYSTERECTOMY     ABDOMINAL SURGERY     CHOLECYSTECTOMY     implanted heart monitor     Social History:  reports that she has never smoked. She has never used smokeless tobacco. She reports that she does not drink alcohol and does not use drugs.  Allergies  Allergen Reactions   Ivp Dye [Iodinated Contrast Media] Shortness Of Breath   Ketorolac Nausea And Vomiting   Morphine And Related Nausea And Vomiting   Tramadol Nausea And Vomiting     History reviewed. No pertinent family history.  Prior to Admission medications   Medication Sig Start Date End Date Taking? Authorizing Provider  amoxicillin-clavulanate (AUGMENTIN) 875-125 MG tablet Take 1 tablet by mouth every 12 (twelve) hours. 10/12/22   Curatolo, Adam, DO  atenolol (TENORMIN) 50 MG tablet Take 50 mg by mouth daily.    [provider]  gabapentin (NEURONTIN) 600 MG tablet Take 600 mg by mouth 4 (four) times daily.    [provider]  oxyCODONE (ROXICODONE) 5 MG immediate release tablet Take 1 tablet (5 mg total) by mouth every 6 (six) hours as needed for up to 10 doses. 10/12/22   Curatolo, Adam, DO  oxyCODONE-acetaminophen (PERCOCET) 5-325 MG tablet Take 1 tablet by mouth every 6 (six) hours as needed for up to 10 doses. 09/17/22   Curatolo, Adam, DO  potassium chloride SA (KLOR-CON M) 20 MEQ tablet Take 1 tablet (20 mEq total) by mouth 2 (two) times daily for 4 days. 09/17/22 09/21/22  Virgina Norfolk, DO  promethazine (PHENERGAN) 25 MG tablet Take 1 tablet (25 mg total) by mouth every 6 (six) hours as needed for nausea or vomiting. 10/12/22   Curatolo, Adam, DO  tiZANidine (ZANAFLEX) 4 MG capsule Take 4 mg by mouth 4 (four) times daily.    [provider]    Physical Exam: Vitals:   11/14/22 2245 11/14/22 2255 11/14/22 2340 11/15/22 0000  BP: (!) 75/48  91/60 (!) 88/57  Pulse: 63  68 66  Resp: Temp:  98.2 F (36.8 C) 98.1 F (36.7 C) (!) 97.5 F (36.4 C)  TempSrc:  Oral Oral Oral  SpO2: 95%  100% 100%  Weight:      Height:       Constitutional: NAD, calm, comfortable, non-toxic appearing elderly female laying in bed Eyes: lids and conjunctivae normal ENMT: Mucous membranes are moist.  Neck: normal, supple Respiratory: clear to auscultation bilaterally, no wheezing, no crackles. Normal respiratory effort. No accessory muscle use.  Cardiovascular: Regular rate and rhythm, no murmurs / rubs / gallops. No extremity edema.   Abdomen:soft, non-distended, moderate tenderness to epigastric region and mild pain to RUQ. no masses palpated. No rebound tenderness, guarding or rigidity.  Musculoskeletal: no clubbing / cyanosis. No joint deformity upper and lower extremities. Normal muscle tone.  Skin: no rashes, lesions, ulcers. No induration Neurologic: CN 2-12 grossly intact. Psychiatric: Normal judgment and insight. Alert and oriented x 3. Normal mood. Data Reviewed:  See HPI  Assessment and Plan: * Acute pancreatitis -Lipase elevated to >400. Normal LFT. Has hx of cholecystectomy. Denies ETOH use. Reports hx of chronic pancreatitis. Will check triglycerides  -CT abd/pelvis wo contrast with abnormal edema in the region of the pancreatic head and duodenal C loop.No free air.  -aggressive IV fluid hydration -PRN IV opioid for pain -continue home oxycodone for breakthrough pain    Hypotension -initially had elevated BP in ED but on arrival to [facility] SBP in 90s -keep on continuous IV fluids -not on any home antihypertensives  Insomnia -home Lunesta non-formulary -continue with ambien here  Chronic pain -continue oxycodone IR  q6hr PRN -Have verified [place] substance database and oxycodone script is uptodate and from one prescriber. -IV PRN opioids for more severe pain with ongoing acute pancreatitis  AKI (acute kidney injury) -Cr up from 0.78 to 1.01. -IV fluids overnight and follow renal trend in the morning  Hypomagnesemia -Mg of 1.5 -oral Mg administered    Hypokalemia -repleted with both IV and oral potassium -follow repeat morning BMP      Advance Care Planning:   Code Status: Full Code   Consults: none  Family Communication: none at bedside  Severity of Illness: The appropriate patient status for this patient is OBSERVATION. Observation status is judged to be reasonable and necessary in order to provide the required intensity of service to ensure the patient's safety. The  patient's presenting symptoms, physical exam findings, and initial radiographic and laboratory data in the context of their medical condition is felt to place them at decreased risk for further clinical deterioration. Furthermore, it is anticipated that the patient will be medically stable for discharge from the hospital within 2 midnights of admission.   Author: Anselm Jungling, DO 11/15/2022 12:34 AM  For on call review [email or web address].

## 2022-11-14 NOTE — ED Provider Notes (Signed)
4:27 PM EMERGENCY DEPARTMENT  US GUIDANCE EXAM Emergency Ultrasound:  US Guidance for Needle Guidance  INDICATIONS: Difficult vascular access Linear probe used in real-time to visualize location of needle entry through skin.   PERFORMED BY: Myself IMAGES ARCHIVED?: No LIMITATIONS:  scar tissue VIEWS USED: Transverse INTERPRETATION: Needle visualized within vein, Left arm, and Needle gauge 18    Michalla Ringer, Canary Brim, MD 11/14/22 1627

## 2022-11-14 NOTE — H&P (Incomplete)
History and Physical    PatientMarland Kitchen Clytee Pitts ZOX:[id number] DOB: 19-Aug-1960 DOA: 11/14/2022 DOS: the patient was seen and examined on 11/14/2022 PCP: [facility]  Patient coming from:  [facility] ED  Chief Complaint:  Chief Complaint  Patient presents with  . Abdominal Pain   HPI: Rachel Pitts is a 62 y.o. female with medical history significant of with HTN, SVT, chronic abdominal pain presented with worsening epigastric pain, N/V for 4 days.    In the ED, normotensive with elevated BP.    Leukocytosis of 19K.  Lipase elevated to >400. Normal LFT.    CT abd/pelvis wo contrast with abnormal edema in the region of the pancreatic head and duodenal C loop. The differential diagnosis is that of acute pancreatitis versus antritis/duodenitis/ulcer disease. No free air.    Also has hypokalemia 2.7, Hypomagnesia of 1.5 and AKI Cr up from 0.78 to 1.01.    Has received IV fluids, pain meds and potassium repletion.     Review of Systems: As mentioned in the history of present illness. All other systems reviewed and are negative. Past Medical History:  Diagnosis Date  . Cancer    cervical  . GERD (gastroesophageal reflux disease)   . Hypertension   . Irregular heart rate   . Pancreatitis    Past Surgical History:  Procedure Laterality Date  . ABDOMINAL HYSTERECTOMY    . ABDOMINAL SURGERY    . CHOLECYSTECTOMY    . implanted heart monitor     Social History:  reports that she has never smoked. She has never used smokeless tobacco. She reports that she does not drink alcohol and does not use drugs.  Allergies  Allergen Reactions  . Ivp Dye [Iodinated Contrast Media] Shortness Of Breath  . Ketorolac Nausea And Vomiting  . Morphine And Related Nausea And Vomiting  . Tramadol Nausea And Vomiting    History reviewed. No pertinent family history.  Prior to Admission medications   Medication Sig Start Date End Date Taking? Authorizing Provider  amoxicillin-clavulanate  (AUGMENTIN) 875-125 MG tablet Take 1 tablet by mouth every 12 (twelve) hours. 10/12/22   Curatolo, Adam, DO  atenolol (TENORMIN) 50 MG tablet Take 50 mg by mouth daily.    [provider]  gabapentin (NEURONTIN) 600 MG tablet Take 600 mg by mouth 4 (four) times daily.    [provider]  oxyCODONE (ROXICODONE) 5 MG immediate release tablet Take 1 tablet (5 mg total) by mouth every 6 (six) hours as needed for up to 10 doses. 10/12/22   Curatolo, Adam, DO  oxyCODONE-acetaminophen (PERCOCET) 5-325 MG tablet Take 1 tablet by mouth every 6 (six) hours as needed for up to 10 doses. 09/17/22   Curatolo, Adam, DO  potassium chloride SA (KLOR-CON M) 20 MEQ tablet Take 1 tablet (20 mEq total) by mouth 2 (two) times daily for 4 days. 09/17/22 09/21/22  Virgina Norfolk, DO  promethazine (PHENERGAN) 25 MG tablet Take 1 tablet (25 mg total) by mouth every 6 (six) hours as needed for nausea or vomiting. 10/12/22   Curatolo, Adam, DO  tiZANidine (ZANAFLEX) 4 MG capsule Take 4 mg by mouth 4 (four) times daily.    [provider]    Physical Exam: Vitals:   11/14/22 2130 11/14/22 2245 11/14/22 2255 11/14/22 2340  BP:  (!) 75/48  91/60  Pulse: 78 63  68  Resp: (!) Temp:   98.2 F (36.8 C) 98.1 F (36.7 C)  TempSrc:  Oral Oral  SpO2: 95% 95%  100%  Weight:      Height:       Constitutional: NAD, calm, comfortable Eyes: PERRL, lids and conjunctivae normal ENMT: Mucous membranes are moist. Posterior pharynx clear of any exudate or lesions.Normal dentition.  Neck: normal, supple, no masses, no thyromegaly Respiratory: clear to auscultation bilaterally, no wheezing, no crackles. Normal respiratory effort. No accessory muscle use.  Cardiovascular: Regular rate and rhythm, no murmurs / rubs / gallops. No extremity edema. 2+ pedal pulses. No carotid bruits.  Abdomen: no tenderness, no masses palpated. No hepatosplenomegaly. Bowel sounds positive.  Musculoskeletal: no clubbing /  cyanosis. No joint deformity upper and lower extremities. Good ROM, no contractures. Normal muscle tone.  Skin: no rashes, lesions, ulcers. No induration Neurologic: CN 2-12 grossly intact. Sensation intact, DTR normal. Strength 5/5 in all 4.  Psychiatric: Normal judgment and insight. Alert and oriented x 3. Normal mood. Data Reviewed: {Tip this will not be part of the note when signed- Document your independent interpretation of telemetry tracing, EKG, lab, Radiology test or any other diagnostic tests. Add any new diagnostic test ordered today. (Optional):[id number]} See HPI  Assessment and Plan: No notes have been filed under this hospital service. Service: Hospitalist     Advance Care Planning:   Code Status: Full Code   Consults: none  Family Communication: ***  Severity of Illness: The appropriate patient status for this patient is OBSERVATION. Observation status is judged to be reasonable and necessary in order to provide the required intensity of service to ensure the patient's safety. The patient's presenting symptoms, physical exam findings, and initial radiographic and laboratory data in the context of their medical condition is felt to place them at decreased risk for further clinical deterioration. Furthermore, it is anticipated that the patient will be medically stable for discharge from the hospital within 2 midnights of admission.   Author: Anselm Jungling, DO 11/14/2022 11:55 PM  For on call review [email or web address].

## 2022-11-14 NOTE — ED Notes (Signed)
EDP notified of BP 

## 2022-11-15 DIAGNOSIS — G4700 Insomnia, unspecified: Secondary | ICD-10-CM

## 2022-11-15 DIAGNOSIS — E876 Hypokalemia: Secondary | ICD-10-CM | POA: Diagnosis present

## 2022-11-15 DIAGNOSIS — I959 Hypotension, unspecified: Secondary | ICD-10-CM | POA: Diagnosis present

## 2022-11-15 DIAGNOSIS — G8929 Other chronic pain: Secondary | ICD-10-CM

## 2022-11-15 DIAGNOSIS — K859 Acute pancreatitis without necrosis or infection, unspecified: Secondary | ICD-10-CM | POA: Diagnosis present

## 2022-11-15 DIAGNOSIS — Z885 Allergy status to narcotic agent status: Secondary | ICD-10-CM | POA: Diagnosis not present

## 2022-11-15 DIAGNOSIS — Z886 Allergy status to analgesic agent status: Secondary | ICD-10-CM | POA: Diagnosis not present

## 2022-11-15 DIAGNOSIS — Z888 Allergy status to other drugs, medicaments and biological substances status: Secondary | ICD-10-CM | POA: Diagnosis not present

## 2022-11-15 DIAGNOSIS — N179 Acute kidney failure, unspecified: Secondary | ICD-10-CM

## 2022-11-15 DIAGNOSIS — K529 Noninfective gastroenteritis and colitis, unspecified: Secondary | ICD-10-CM | POA: Diagnosis present

## 2022-11-15 DIAGNOSIS — K861 Other chronic pancreatitis: Secondary | ICD-10-CM | POA: Diagnosis present

## 2022-11-15 DIAGNOSIS — I471 Supraventricular tachycardia, unspecified: Secondary | ICD-10-CM | POA: Diagnosis present

## 2022-11-15 DIAGNOSIS — R112 Nausea with vomiting, unspecified: Secondary | ICD-10-CM | POA: Diagnosis present

## 2022-11-15 DIAGNOSIS — Z91041 Radiographic dye allergy status: Secondary | ICD-10-CM | POA: Diagnosis not present

## 2022-11-15 DIAGNOSIS — K219 Gastro-esophageal reflux disease without esophagitis: Secondary | ICD-10-CM | POA: Diagnosis present

## 2022-11-15 DIAGNOSIS — I10 Essential (primary) hypertension: Secondary | ICD-10-CM | POA: Diagnosis present

## 2022-11-15 DIAGNOSIS — E860 Dehydration: Secondary | ICD-10-CM | POA: Diagnosis present

## 2022-11-15 DIAGNOSIS — Z79899 Other long term (current) drug therapy: Secondary | ICD-10-CM | POA: Diagnosis not present

## 2022-11-15 LAB — CBC
HCT: 33.4 % — ABNORMAL LOW (ref 36.0–46.0)
Hemoglobin: 11.1 g/dL — ABNORMAL LOW (ref 12.0–15.0)
MCH: 30.3 pg (ref 26.0–34.0)
MCHC: 33.2 g/dL (ref 30.0–36.0)
MCV: 91.3 fL (ref 80.0–100.0)
Platelets: 219 10*3/uL (ref 150–400)
RBC: 3.66 MIL/uL — ABNORMAL LOW (ref 3.87–5.11)
RDW: 12.8 % (ref 11.5–15.5)
WBC: 7.2 10*3/uL (ref 4.0–10.5)
nRBC: 0 % (ref 0.0–0.2)

## 2022-11-15 LAB — BASIC METABOLIC PANEL
Anion gap: 10 (ref 5–15)
BUN: 13 mg/dL (ref 8–23)
CO2: 18 mmol/L — ABNORMAL LOW (ref 22–32)
Calcium: 8.4 mg/dL — ABNORMAL LOW (ref 8.9–10.3)
Chloride: 111 mmol/L (ref 98–111)
Creatinine, Ser: 0.86 mg/dL (ref 0.44–1.00)
GFR, Estimated: >60 mL/min (ref >60)
Glucose, Bld: 94 mg/dL (ref 70–99)
Potassium: 4.1 mmol/L (ref 3.5–5.1)
Sodium: 139 mmol/L (ref 135–145)

## 2022-11-15 LAB — LIPASE, BLOOD: Lipase: 187 U/L — ABNORMAL HIGH (ref 11–51)

## 2022-11-15 LAB — TRIGLYCERIDES: Triglycerides: 137 mg/dL (ref <150)

## 2022-11-15 LAB — HIV ANTIBODY (ROUTINE TESTING W REFLEX): HIV Screen 4th Generation wRfx: NONREACTIVE

## 2022-11-15 MED ORDER — HYDROMORPHONE HCL 1 MG/ML IJ SOLN: 1.0000 mg | INTRAMUSCULAR | Status: DC | PRN

## 2022-11-15 MED ORDER — OXYCODONE HCL 5 MG PO TABS
15.0000 mg | ORAL_TABLET | Freq: Four times a day (QID) | ORAL | Status: DC | PRN
  Administered 2022-11-15 – 2022-11-18 (×8): 15 mg via ORAL
  Filled 2022-11-15 (×9): qty 3

## 2022-11-15 MED ORDER — LACTATED RINGERS IV SOLN: INTRAVENOUS | Status: DC

## 2022-11-15 MED ORDER — HEPARIN SOD (PORK) LOCK FLUSH 100 UNIT/ML IV SOLN
500.0000 [IU] | INTRAVENOUS | Status: AC | PRN
  Administered 2022-11-15: 500 [IU]

## 2022-11-15 MED ORDER — ONDANSETRON HCL 4 MG/2ML IJ SOLN
4.0000 mg | Freq: Four times a day (QID) | INTRAMUSCULAR | Status: DC | PRN
  Administered 2022-11-15 – 2022-11-17 (×6): 4 mg via INTRAVENOUS
  Filled 2022-11-15 (×6): qty 2

## 2022-11-15 MED ORDER — HYDROMORPHONE HCL 1 MG/ML IJ SOLN
1.0000 mg | INTRAMUSCULAR | Status: DC | PRN
  Administered 2022-11-15 – 2022-11-17 (×13): 1 mg via INTRAVENOUS
  Filled 2022-11-15 (×13): qty 1

## 2022-11-15 MED ORDER — ZOLPIDEM TARTRATE 5 MG PO TABS
5.0000 mg | ORAL_TABLET | Freq: Every evening | ORAL | Status: DC | PRN
  Administered 2022-11-15 – 2022-11-17 (×4): 5 mg via ORAL
  Filled 2022-11-15 (×4): qty 1

## 2022-11-15 MED ORDER — OXYCODONE HCL 5 MG PO TABS: 15.0000 mg | ORAL_TABLET | Freq: Four times a day (QID) | ORAL | Status: DC | PRN

## 2022-11-15 MED ORDER — PANTOPRAZOLE SODIUM 40 MG PO TBEC
40.0000 mg | DELAYED_RELEASE_TABLET | Freq: Every day | ORAL | Status: DC
  Administered 2022-11-15 – 2022-11-18 (×4): 40 mg via ORAL
  Filled 2022-11-15 (×4): qty 1

## 2022-11-15 NOTE — Assessment & Plan Note (Signed)
-  home Lunesta non-formulary -continue with Remus Loffler here

## 2022-11-15 NOTE — Assessment & Plan Note (Addendum)
-  continue oxycodone IR  q6hr PRN -Have verified [place] substance database and oxycodone script is uptodate and from one prescriber. -IV PRN opioids for more severe pain with ongoing acute pancreatitis

## 2022-11-15 NOTE — Assessment & Plan Note (Signed)
-  repleted with both IV and oral potassium -follow repeat morning BMP

## 2022-11-15 NOTE — TOC CM/SW Note (Signed)
  Transition of Care (TOC) Screening Note   Patient Details  Name: Rachel Pitts Date of Birth: 05-26-1961   Transition of Care [facility]) CM/SW Contact:    Amada Jupiter, LCSW Phone Number: 11/15/2022, 9:49 AM    Transition of Care Department [facility]) has reviewed patient and no TOC needs have been identified at this time. We will continue to monitor patient advancement through interdisciplinary progression rounds. If new patient transition needs arise, please place a TOC consult.

## 2022-11-15 NOTE — Assessment & Plan Note (Signed)
-  Mg of 1.5 -oral Mg administered

## 2022-11-15 NOTE — Plan of Care (Signed)

## 2022-11-15 NOTE — Assessment & Plan Note (Signed)
-  Lipase elevated to >400. Normal LFT. Has hx of cholecystectomy. Denies ETOH use. Reports hx of chronic pancreatitis. Will check triglycerides  -CT abd/pelvis wo contrast with abnormal edema in the region of the pancreatic head and duodenal C loop.No free air.  -aggressive IV fluid hydration -PRN IV opioid for pain -continue home oxycodone for breakthrough pain

## 2022-11-15 NOTE — Progress Notes (Signed)
PROGRESS NOTE    Rachel Pitts  RUE:[id number] DOB: 12-13-1960 DOA: 11/14/2022 PCP: [facility]    Brief Narrative:   Rachel Pitts is a 62 y.o. female with past medical history significant for HTN, SVT, chronic abdominal pain, history of chronic pancreatitis who presented to med [facility] ED on 4/22 with complaints of abdominal pain associate with nausea and vomiting.  Pain is localized to the epigastric region and reports unable to keep any food/water down.  Denies NSAID or alcohol abuse.  Previous history of chronic pancreatitis but has not been hospitalized for many years.  Headache, no fever/chills, no chest pain, no palpitations, no urinary symptoms.  In the ED, temperature 98.0 F, HR 71, RR 22, BP 114/75, SpO2 100% on room air.  WBC 19.1, hemoglobin 14.1, platelets 303.  Sodium 135, potassium 2.7, chloride 101, CO2 19, glucose 115, BUN 17, creat 1.01.  AST 22, ALT 12, total bilirubin 0.8.  Lipase 495.  Urinalysis unrevealing.  CT abdomen/pelvis with abnormal edema region of the pancreatic head and duodenal C-loop, differential of acute pancreatitis versus antritis/duodenitis/ulcer disease, no free air.  Patient received IV potassium replacement, IV fluids, pain medication.  TRH was consulted for admission for further evaluation and management and patient was transferred to [facility].  Assessment & Plan:  Acute Pancreatitis Patient presented to ED with progressive abdominal pain associated with nausea and vomiting.  Lipase elevated 495.  CT abdomen/pelvis notable for edema surrounding the pancreatic head and duodenal C-loop.  Patient with history of chronic pancreatitis in the past.  Denies NSAID, alcohol use.  Triglyceride level 137. -- Lipase 495>187 -- Start Clear liquid diet; will advance further as tolerates -- LR at 125 mL/h -- Oxycodone 15 mg q6h PRN moderate pain -- Dilaudid  IV q3h PRN severe pain -- Zofran IV PRN -- follow CMP, lipase  daily  Hypokalemia Hypomagnesemia Etiology likely secondary to GI loss from nausea/vomiting.  Repleted. -- Repeat electrolytes in a.m.  Acute renal failure Baseline creatinine 0.78, on admission creatinine up to 1.01.  Etiology likely secondary to dehydration from acute pancreatitis as above. -- Continue IVF with LR at 125 mL/h -- CMP in the a.m.  Leukocytosis: Resolved WBC count elevated at 19.1 on admission, etiology likely reactive/hemoconcentration in the setting of dehydration from pancreatitis as above. --WBC 19.1>7.2  Chronic abdominal pain -- Continue home oxycodone 15 mg p.o. every 6 hours as needed  Insomnia -- Ambien 5 mg p.o. nightly as needed  GERD -- Continue PPI     DVT prophylaxis: enoxaparin (LOVENOX) injection 40 mg Start: 11/15/22 1000    Code Status: Full Code Family Communication: Updated spouse present at bedside this morning  Disposition Plan:  Level of care: Med-Surg Status is: Observation The patient remains OBS appropriate and will d/c before 2 midnights.    Consultants:  None  Procedures:  None  Antimicrobials:  None   Subjective: Patient seen examined bedside, resting comfortably.  Lying in bed.  Spouse present.  Continues to report abdominal pain but no further nausea or vomiting.  Wishes to try something to eat/drink this morning.  Discussed with patient will start a clear liquid diet and advance as she tolerates.  No other specific questions or concerns at this time.  Denies headache, no dizziness, no chest pain, no palpitations, no shortness of breath, no fever/chills/night sweats, no diarrhea, no focal weakness, no fatigue, no paresthesias.  No acute events overnight per nursing staff.  Objective: Vitals:   11/15/22 0000  11/15/22 0158 11/15/22 0524 11/15/22 0815  BP: (!) 88/57 103/67 119/66 (!) 152/87  Pulse: 66 79 88 89  Resp: 18 18 18    Temp: (!) 97.5 F (36.4 C) 98.1 F (36.7 C) 98.5 F (36.9 C) 98 F (36.7 C)   TempSrc: Oral Oral Oral Oral  SpO2: 100% 100% 97% 99%  Weight:      Height:        Intake/Output Summary (Last 24 hours) at 11/15/2022 1019 Last data filed at 11/15/2022 1610 Gross per 24 hour  Intake 2187.98 ml  Output 600 ml  Net 1587.98 ml   Filed Weights   11/14/22 1324  Weight: 47.6 kg    Examination:  Physical Exam: GEN: NAD, alert and oriented x 3, wd/wn HEENT: NCAT, PERRL, EOMI, sclera clear, MMM PULM: CTAB w/o wheezes/crackles, normal respiratory effort, on room air CV: RRR w/o M/G/R GI: abd soft, mild tenderness epigastric region, nondistended, NABS, no R/G/M MSK: no peripheral edema, muscle strength globally intact 5/5 bilateral upper/lower extremities NEURO: CN II-XII intact, no focal deficits, sensation to light touch intact PSYCH: normal mood/affect Integumentary: dry/intact, no rashes or wounds    Data Reviewed: I have personally reviewed following labs and imaging studies  CBC: Recent Labs  Lab 11/14/22 1328 11/15/22 0559  WBC 19.1* 7.2  HGB 14.1 11.1*  HCT 41.2 33.4*  MCV 88.0 91.3  PLT 303 219   Basic Metabolic Panel: Recent Labs  Lab 11/14/22 1328 11/14/22 1724 11/15/22 0559  NA 135  --  139  K 2.7*  --  4.1  CL 101  --  111  CO2 19*  --  18*  GLUCOSE 115*  --  94  BUN 17  --  13  CREATININE 1.01*  --  0.86  CALCIUM 9.1  --  8.4*  MG  --  1.5*  --    GFR: Estimated Creatinine Clearance: 49.3 mL/min (by C-G formula based on SCr of 0.86 mg/dL). Liver Function Tests: Recent Labs  Lab 11/14/22 1328  AST 22  ALT 12  ALKPHOS 92  BILITOT 0.8  PROT 8.4*  ALBUMIN 4.3   Recent Labs  Lab 11/14/22 1328 11/15/22 0559  LIPASE 495* 187*   No results for input(s): "AMMONIA" in the last 168 hours. Coagulation Profile: No results for input(s): "INR", "PROTIME" in the last 168 hours. Cardiac Enzymes: No results for input(s): "CKTOTAL", "CKMB", "CKMBINDEX", "TROPONINI" in the last 168 hours. BNP (last 3 results) No results for  input(s): "PROBNP" in the last 8760 hours. HbA1C: No results for input(s): "HGBA1C" in the last 72 hours. CBG: No results for input(s): "GLUCAP" in the last 168 hours. Lipid Profile: Recent Labs    11/15/22 0559  TRIG 137   Thyroid Function Tests: No results for input(s): "TSH", "T4TOTAL", "FREET4", "T3FREE", "THYROIDAB" in the last 72 hours. Anemia Panel: No results for input(s): "VITAMINB12", "FOLATE", "FERRITIN", "TIBC", "IRON", "RETICCTPCT" in the last 72 hours. Sepsis Labs: No results for input(s): "PROCALCITON", "LATICACIDVEN" in the last 168 hours.  No results found for this or any previous visit (from the past 240 hour(s)).       Radiology Studies: CT ABDOMEN PELVIS WO CONTRAST  Result Date: 11/14/2022 CLINICAL DATA:  Abdominal pain, acute, nonlocalized. Chronic pancreatitis. Recent episode of colitis. EXAM: CT ABDOMEN AND PELVIS WITHOUT CONTRAST TECHNIQUE: Multidetector CT imaging of the abdomen and pelvis was performed following the standard protocol without IV contrast. RADIATION DOSE REDUCTION: This exam was performed according to the departmental dose-optimization program which includes  automated exposure control, adjustment of the mA and/or kV according to patient size and/or use of iterative reconstruction technique. COMPARISON:  10/20/2022 FINDINGS: Lower chest: Lung bases are clear. Hepatobiliary: Liver parenchyma is normal without contrast. Previous cholecystectomy. Pancreas: Recurrent pancreatitis of the pancreatic head region with surrounding regional edema. The differential diagnosis does include gastritis/duodenitis or ulcer disease. Spleen: Normal Adrenals/Urinary Tract: Adrenal glands are normal. Kidneys are normal. No stone or hydronephrosis. Bladder is full but normal. Stomach/Bowel: Majority of the stomach appears normal. See above discussion regarding pancreatitis of the head region versus duodenitis/ulcer disease. Beyond that, the small bowel is normal. Normal  appendix. Normal colon. No sign of colitis as was question previously. Vascular/Lymphatic: Aortic atherosclerosis. No aneurysm. IVC is normal. No adenopathy. Reproductive: Previous hysterectomy.  No pelvic mass. Other: No free fluid or air. Musculoskeletal: No significant musculoskeletal finding. IMPRESSION: 1. Abnormal edema in the region of the pancreatic head and duodenal C loop. The differential diagnosis is that of acute pancreatitis versus antritis/duodenitis/ulcer disease. No free air. 2. Previous cholecystectomy and hysterectomy. 3. Aortic atherosclerosis. Aortic Atherosclerosis (ICD10-I70.0). Electronically Signed   By: Paulina Fusi M.D.   On: 11/14/2022 16:03        Scheduled Meds:  enoxaparin (LOVENOX) injection  40 mg Subcutaneous Q24H   pantoprazole  40 mg Oral Daily   Continuous Infusions:  sodium chloride Stopped (11/14/22 2243)   lactated ringers       LOS: 0 days    Time spent: 51 minutes spent on chart review, discussion with nursing staff, consultants, updating family and interview/physical exam; more than 50% of that time was spent in counseling and/or coordination of care.    Alvira Philips [place], DO [facility] Available via Epic secure chat 7am-7pm After these hours, please refer to coverage provider listed on [email or web address] 11/15/2022, 10:19 AM

## 2022-11-15 NOTE — Assessment & Plan Note (Signed)
-  Cr up from 0.78 to 1.01. -IV fluids overnight and follow renal trend in the morning

## 2022-11-15 NOTE — Assessment & Plan Note (Signed)
-  initially had elevated BP in ED but on arrival to [facility] SBP in 90s -keep on continuous IV fluids -not on any home antihypertensives

## 2022-11-16 DIAGNOSIS — K859 Acute pancreatitis without necrosis or infection, unspecified: Secondary | ICD-10-CM | POA: Diagnosis not present

## 2022-11-16 LAB — COMPREHENSIVE METABOLIC PANEL
ALT: 10 U/L (ref 0–44)
AST: 13 U/L — ABNORMAL LOW (ref 15–41)
Albumin: 3.6 g/dL (ref 3.5–5.0)
Alkaline Phosphatase: 74 U/L (ref 38–126)
Anion gap: 12 (ref 5–15)
BUN: 9 mg/dL (ref 8–23)
CO2: 22 mmol/L (ref 22–32)
Calcium: 9.1 mg/dL (ref 8.9–10.3)
Chloride: 104 mmol/L (ref 98–111)
Creatinine, Ser: 0.81 mg/dL (ref 0.44–1.00)
GFR, Estimated: >60 mL/min (ref >60)
Glucose, Bld: 80 mg/dL (ref 70–99)
Potassium: 3.7 mmol/L (ref 3.5–5.1)
Sodium: 138 mmol/L (ref 135–145)
Total Bilirubin: 0.7 mg/dL (ref 0.3–1.2)
Total Protein: 7.1 g/dL (ref 6.5–8.1)

## 2022-11-16 LAB — MAGNESIUM: Magnesium: 1.8 mg/dL (ref 1.7–2.4)

## 2022-11-16 LAB — LIPASE, BLOOD: Lipase: 69 U/L — ABNORMAL HIGH (ref 11–51)

## 2022-11-16 NOTE — Progress Notes (Signed)
62 year old female admitted for acute pancreatitis. Patient complains of abdominal pain and nausea. Temperature: 98.2, HR: 89, BP: 161/90, RR: 18, O2: 96% at room air, and glucose: 80. Alert and oriented x 4. Speech is clear. Patient walks independently with no assisted devices. Uses eyeglasses for vision. Skin is clean, dry, and intact. S1 and S2 heard. Regular rate and rhythm. No murmurs. Lung sounds were auscultated and sounded clear. Active bowel sounds heard in all 4 quadrants. Abdominal pain at RUQ was a 10/10 prior to pain medication. Pain was constant, ongoing, sharp, and radiates towards the right. After pain medication pain reduced to 6/10 and showed signs of relief. Patient also took an antiemetic and states that she no longer feels nauseated. Patent IV line on upper left arm. Patient walks to toilet to urinate. Urine is clear and yellow. Sensation of extremities present bilaterally. No deformities. Pedal pulses 3+ bilaterally.  Mel Almond, Student RN

## 2022-11-16 NOTE — Progress Notes (Signed)
PROGRESS NOTE    Rachel Pitts  WUJ:[id number] DOB: 03/24/1961 DOA: 11/14/2022 PCP: [facility]    Brief Narrative:   Rachel Pitts is a 62 y.o. female with past medical history significant for HTN, SVT, chronic abdominal pain, history of chronic pancreatitis who presented to med [facility] ED on 4/22 with complaints of abdominal pain associate with nausea and vomiting.  Pain is localized to the epigastric region and reports unable to keep any food/water down.  Denies NSAID or alcohol abuse.  Previous history of chronic pancreatitis but has not been hospitalized for many years.  Headache, no fever/chills, no chest pain, no palpitations, no urinary symptoms.  In the ED, temperature 98.0 F, HR 71, RR 22, BP 114/75, SpO2 100% on room air.  WBC 19.1, hemoglobin 14.1, platelets 303.  Sodium 135, potassium 2.7, chloride 101, CO2 19, glucose 115, BUN 17, creat 1.01.  AST 22, ALT 12, total bilirubin 0.8.  Lipase 495.  Urinalysis unrevealing.  CT abdomen/pelvis with abnormal edema region of the pancreatic head and duodenal C-loop, differential of acute pancreatitis versus antritis/duodenitis/ulcer disease, no free air.  Patient received IV potassium replacement, IV fluids, pain medication.  TRH was consulted for admission for further evaluation and management and patient was transferred to [facility].  Assessment & Plan:  Acute Pancreatitis Patient presented to ED with progressive abdominal pain associated with nausea and vomiting.  Lipase elevated 495.  CT abdomen/pelvis notable for edema surrounding the pancreatic head and duodenal C-loop.  Patient with history of chronic pancreatitis in the past.  Denies NSAID, alcohol use.  Triglyceride level 137. -- Lipase [phone] -- Advance to full liquid diet today -- LR at 100 mL/h -- Oxycodone 15 mg q6h PRN moderate pain -- Dilaudid  IV q3h PRN severe pain -- Zofran IV PRN -- follow CMP, lipase  daily  Hypokalemia Hypomagnesemia Etiology likely secondary to GI loss from nausea/vomiting.  Repleted. -- Repeat electrolytes in a.m.  Acute renal failure Baseline creatinine 0.78, on admission creatinine up to 1.01.  Etiology likely secondary to dehydration from acute pancreatitis as above. -- Continue IVF with LR at 100 mL/h -- CMP in the a.m.  Leukocytosis: Resolved WBC count elevated at 19.1 on admission, etiology likely reactive/hemoconcentration in the setting of dehydration from pancreatitis as above. --WBC 19.1>7.2  Chronic abdominal pain -- Continue home oxycodone 15 mg p.o. every 6 hours as needed  Insomnia -- Ambien 5 mg p.o. nightly as needed  GERD -- Continue PPI     DVT prophylaxis: enoxaparin (LOVENOX) injection 40 mg Start: 11/15/22 1000    Code Status: Full Code Family Communication: Updated spouse present at bedside this morning  Disposition Plan:  Level of care: Med-Surg Status is: Inpatient Remains inpatient appropriate because: IV fluids, needs further diet advancement with toleration before ready for discharge home, anticipate discharge home in 1-2 days     Consultants:  None  Procedures:  None  Antimicrobials:  None   Subjective: Patient seen examined bedside, resting comfortably.  Lying in bed.  Spouse present.  Mild abdominal pain, currently tolerating clear liquid diet.  Received IV Dilaudid this morning.  Discussed lipase has markedly improved and electrolytes remained stable.  Will floor advance diet to full liquids today.  If tolerates anticipate likely further advancement tomorrow to soft diet with possible discharge home tomorrow afternoon.  No other specific questions or concerns at this time.  Denies headache, no dizziness, no chest pain, no palpitations, no shortness of breath, no fever/chills/night  sweats, no diarrhea, no focal weakness, no fatigue, no paresthesias.  No acute events overnight per nursing  staff.  Objective: Vitals:   11/15/22 1221 11/15/22 1716 11/15/22 2029 11/16/22 0528  BP: 111/66 (!) 140/91 (!) 152/87 (!) 161/90  Pulse: 71 95 74 89  Resp: 18  18 18   Temp: 97.7 F (36.5 C)  97.9 F (36.6 C) 98.2 F (36.8 C)  TempSrc: Oral   Oral  SpO2: 99% 100% 98% 96%  Weight:      Height:        Intake/Output Summary (Last 24 hours) at 11/16/2022 1101 Last data filed at 11/16/2022 0458 Gross per 24 hour  Intake 2019.74 ml  Output --  Net 2019.74 ml   Filed Weights   11/14/22 1324  Weight: 47.6 kg    Examination:  Physical Exam: GEN: NAD, alert and oriented x 3, wd/wn HEENT: NCAT, PERRL, EOMI, sclera clear, MMM PULM: CTAB w/o wheezes/crackles, normal respiratory effort, on room air CV: RRR w/o M/G/R GI: abd soft, mild tenderness epigastric region, nondistended, NABS, no R/G/M MSK: no peripheral edema, muscle strength globally intact 5/5 bilateral upper/lower extremities NEURO: CN II-XII intact, no focal deficits, sensation to light touch intact PSYCH: normal mood/affect Integumentary: dry/intact, no rashes or wounds    Data Reviewed: I have personally reviewed following labs and imaging studies  CBC: Recent Labs  Lab 11/14/22 1328 11/15/22 0559  WBC 19.1* 7.2  HGB 14.1 11.1*  HCT 41.2 33.4*  MCV 88.0 91.3  PLT 303 219   Basic Metabolic Panel: Recent Labs  Lab 11/14/22 1328 11/14/22 1724 11/15/22 0559 11/16/22 0501  NA 135  --  139 138  K 2.7*  --  4.1 3.7  CL 101  --  111 104  CO2 19*  --  18* 22  GLUCOSE 115*  --  94 80  BUN 17  --  13 9  CREATININE 1.01*  --  0.86 0.81  CALCIUM 9.1  --  8.4* 9.1  MG  --  1.5*  --  1.8   GFR: Estimated Creatinine Clearance: 52.4 mL/min (by C-G formula based on SCr of 0.81 mg/dL). Liver Function Tests: Recent Labs  Lab 11/14/22 1328 11/16/22 0501  AST 22 13*  ALT 12 10  ALKPHOS 92 74  BILITOT 0.8 0.7  PROT 8.4* 7.1  ALBUMIN 4.3 3.6   Recent Labs  Lab 11/14/22 1328 11/15/22 0559  11/16/22 0501  LIPASE 495* 187* 69*   No results for input(s): "AMMONIA" in the last 168 hours. Coagulation Profile: No results for input(s): "INR", "PROTIME" in the last 168 hours. Cardiac Enzymes: No results for input(s): "CKTOTAL", "CKMB", "CKMBINDEX", "TROPONINI" in the last 168 hours. BNP (last 3 results) No results for input(s): "PROBNP" in the last 8760 hours. HbA1C: No results for input(s): "HGBA1C" in the last 72 hours. CBG: No results for input(s): "GLUCAP" in the last 168 hours. Lipid Profile: Recent Labs    11/15/22 0559  TRIG 137   Thyroid Function Tests: No results for input(s): "TSH", "T4TOTAL", "FREET4", "T3FREE", "THYROIDAB" in the last 72 hours. Anemia Panel: No results for input(s): "VITAMINB12", "FOLATE", "FERRITIN", "TIBC", "IRON", "RETICCTPCT" in the last 72 hours. Sepsis Labs: No results for input(s): "PROCALCITON", "LATICACIDVEN" in the last 168 hours.  No results found for this or any previous visit (from the past 240 hour(s)).       Radiology Studies: CT ABDOMEN PELVIS WO CONTRAST  Result Date: 11/14/2022 CLINICAL DATA:  Abdominal pain, acute, nonlocalized. Chronic pancreatitis. Recent  episode of colitis. EXAM: CT ABDOMEN AND PELVIS WITHOUT CONTRAST TECHNIQUE: Multidetector CT imaging of the abdomen and pelvis was performed following the standard protocol without IV contrast. RADIATION DOSE REDUCTION: This exam was performed according to the departmental dose-optimization program which includes automated exposure control, adjustment of the mA and/or kV according to patient size and/or use of iterative reconstruction technique. COMPARISON:  10/20/2022 FINDINGS: Lower chest: Lung bases are clear. Hepatobiliary: Liver parenchyma is normal without contrast. Previous cholecystectomy. Pancreas: Recurrent pancreatitis of the pancreatic head region with surrounding regional edema. The differential diagnosis does include gastritis/duodenitis or ulcer disease.  Spleen: Normal Adrenals/Urinary Tract: Adrenal glands are normal. Kidneys are normal. No stone or hydronephrosis. Bladder is full but normal. Stomach/Bowel: Majority of the stomach appears normal. See above discussion regarding pancreatitis of the head region versus duodenitis/ulcer disease. Beyond that, the small bowel is normal. Normal appendix. Normal colon. No sign of colitis as was question previously. Vascular/Lymphatic: Aortic atherosclerosis. No aneurysm. IVC is normal. No adenopathy. Reproductive: Previous hysterectomy.  No pelvic mass. Other: No free fluid or air. Musculoskeletal: No significant musculoskeletal finding. IMPRESSION: 1. Abnormal edema in the region of the pancreatic head and duodenal C loop. The differential diagnosis is that of acute pancreatitis versus antritis/duodenitis/ulcer disease. No free air. 2. Previous cholecystectomy and hysterectomy. 3. Aortic atherosclerosis. Aortic Atherosclerosis (ICD10-I70.0). Electronically Signed   By: Paulina Fusi M.D.   On: 11/14/2022 16:03        Scheduled Meds:  enoxaparin (LOVENOX) injection  40 mg Subcutaneous Q24H   pantoprazole  40 mg Oral Daily   Continuous Infusions:  sodium chloride Stopped (11/14/22 2243)   lactated ringers 125 mL/hr at 11/16/22 0742     LOS: 1 day    Time spent: 51 minutes spent on chart review, discussion with nursing staff, consultants, updating family and interview/physical exam; more than 50% of that time was spent in counseling and/or coordination of care.    Alvira Philips [place], DO [facility] Available via Epic secure chat 7am-7pm After these hours, please refer to coverage provider listed on [email or web address] 11/16/2022, 11:01 AM

## 2022-11-16 NOTE — Plan of Care (Signed)

## 2022-11-17 ENCOUNTER — Other Ambulatory Visit (HOSPITAL_COMMUNITY): Payer: Self-pay

## 2022-11-17 DIAGNOSIS — K859 Acute pancreatitis without necrosis or infection, unspecified: Secondary | ICD-10-CM | POA: Diagnosis not present

## 2022-11-17 LAB — CBC
HCT: 41.7 % (ref 36.0–46.0)
Hemoglobin: 14.1 g/dL (ref 12.0–15.0)
MCH: 30 pg (ref 26.0–34.0)
MCHC: 33.8 g/dL (ref 30.0–36.0)
MCV: 88.7 fL (ref 80.0–100.0)
Platelets: UNDETERMINED 10*3/uL (ref 150–400)
RBC: 4.7 MIL/uL (ref 3.87–5.11)
RDW: 12.2 % (ref 11.5–15.5)
WBC: 6 10*3/uL (ref 4.0–10.5)
nRBC: 0 % (ref 0.0–0.2)

## 2022-11-17 LAB — COMPREHENSIVE METABOLIC PANEL
ALT: 10 U/L (ref 0–44)
AST: 17 U/L (ref 15–41)
Albumin: 4.1 g/dL (ref 3.5–5.0)
Alkaline Phosphatase: 77 U/L (ref 38–126)
Anion gap: 12 (ref 5–15)
BUN: 7 mg/dL — ABNORMAL LOW (ref 8–23)
CO2: 27 mmol/L (ref 22–32)
Calcium: 9.5 mg/dL (ref 8.9–10.3)
Chloride: 100 mmol/L (ref 98–111)
Creatinine, Ser: 0.93 mg/dL (ref 0.44–1.00)
GFR, Estimated: >60 mL/min (ref >60)
Glucose, Bld: 96 mg/dL (ref 70–99)
Potassium: 3.6 mmol/L (ref 3.5–5.1)
Sodium: 139 mmol/L (ref 135–145)
Total Bilirubin: 0.8 mg/dL (ref 0.3–1.2)
Total Protein: 7.9 g/dL (ref 6.5–8.1)

## 2022-11-17 LAB — MAGNESIUM: Magnesium: 1.7 mg/dL (ref 1.7–2.4)

## 2022-11-17 LAB — LIPASE, BLOOD: Lipase: 47 U/L (ref 11–51)

## 2022-11-17 MED ORDER — LABETALOL HCL 5 MG/ML IV SOLN
10.0000 mg | INTRAVENOUS | Status: DC | PRN
  Administered 2022-11-17 (×2): 10 mg via INTRAVENOUS
  Filled 2022-11-17 (×3): qty 4

## 2022-11-17 MED ORDER — HYDRALAZINE HCL 25 MG PO TABS
25.0000 mg | ORAL_TABLET | Freq: Four times a day (QID) | ORAL | Status: DC | PRN
  Administered 2022-11-17 (×2): 25 mg via ORAL
  Filled 2022-11-17 (×2): qty 1

## 2022-11-17 MED ORDER — AMLODIPINE BESYLATE 5 MG PO TABS
5.0000 mg | ORAL_TABLET | Freq: Every day | ORAL | Status: DC
  Administered 2022-11-17: 5 mg via ORAL
  Filled 2022-11-17: qty 1

## 2022-11-17 MED ORDER — LABETALOL HCL 5 MG/ML IV SOLN
10.0000 mg | INTRAVENOUS | Status: DC | PRN
  Filled 2022-11-17: qty 4

## 2022-11-17 MED ORDER — HYDROMORPHONE HCL 1 MG/ML IJ SOLN
1.0000 mg | Freq: Four times a day (QID) | INTRAMUSCULAR | Status: DC | PRN
  Administered 2022-11-17 – 2022-11-18 (×3): 1 mg via INTRAVENOUS
  Filled 2022-11-17 (×3): qty 1

## 2022-11-17 MED ORDER — CARVEDILOL 12.5 MG PO TABS
12.5000 mg | ORAL_TABLET | Freq: Two times a day (BID) | ORAL | Status: DC
  Administered 2022-11-17 – 2022-11-18 (×2): 12.5 mg via ORAL
  Filled 2022-11-17 (×2): qty 1

## 2022-11-17 MED ORDER — AMLODIPINE BESYLATE 10 MG PO TABS
10.0000 mg | ORAL_TABLET | Freq: Every day | ORAL | Status: DC
  Administered 2022-11-18: 10 mg via ORAL
  Filled 2022-11-17: qty 1

## 2022-11-17 NOTE — Progress Notes (Signed)
PROGRESS NOTE    Rachel Pitts  ZOX:[id number] DOB: 13-Dec-1960 DOA: 11/14/2022 PCP: [facility]    Brief Narrative:   Rachel Pitts is a 62 y.o. female with past medical history significant for HTN, SVT, chronic abdominal pain, history of chronic pancreatitis who presented to med [facility] ED on 4/22 with complaints of abdominal pain associate with nausea and vomiting.  Pain is localized to the epigastric region and reports unable to keep any food/water down.  Denies NSAID or alcohol abuse.  Previous history of chronic pancreatitis but has not been hospitalized for many years.  Headache, no fever/chills, no chest pain, no palpitations, no urinary symptoms.  In the ED, temperature 98.0 F, HR 71, RR 22, BP 114/75, SpO2 100% on room air.  WBC 19.1, hemoglobin 14.1, platelets 303.  Sodium 135, potassium 2.7, chloride 101, CO2 19, glucose 115, BUN 17, creat 1.01.  AST 22, ALT 12, total bilirubin 0.8.  Lipase 495.  Urinalysis unrevealing.  CT abdomen/pelvis with abnormal edema region of the pancreatic head and duodenal C-loop, differential of acute pancreatitis versus antritis/duodenitis/ulcer disease, no free air.  Patient received IV potassium replacement, IV fluids, pain medication.  TRH was consulted for admission for further evaluation and management and patient was transferred to [facility].  Assessment & Plan:  Acute Pancreatitis Patient presented to ED with progressive abdominal pain associated with nausea and vomiting.  Lipase elevated 495.  CT abdomen/pelvis notable for edema surrounding the pancreatic head and duodenal C-loop.  Patient with history of chronic pancreatitis in the past.  Denies NSAID, alcohol use.  Triglyceride level 137. -- Lipase [phone] -- Advance to soft diet today -- LR at 100 mL/h -- Oxycodone 15 mg q6h PRN moderate pain -- Dilaudid  IV q6h PRN severe pain not controlled with oral medication -- Zofran IV PRN -- follow CMP, lipase  daily  Hypokalemia Hypomagnesemia Etiology likely secondary to GI loss from nausea/vomiting.  Repleted. -- Repeat electrolytes in a.m.  Acute renal failure Baseline creatinine 0.78, on admission creatinine up to 1.01.  Etiology likely secondary to dehydration from acute pancreatitis as above. -- Continue IVF with LR at 100 mL/h -- CMP in the a.m.  Leukocytosis: Resolved WBC count elevated at 19.1 on admission, etiology likely reactive/hemoconcentration in the setting of dehydration from pancreatitis as above. --WBC 19.1>7.2>6.0  Chronic abdominal pain -- Continue home oxycodone 15 mg p.o. every 6 hours as needed  Insomnia -- Ambien 5 mg p.o. nightly as needed  GERD -- Continue PPI     DVT prophylaxis: enoxaparin (LOVENOX) injection 40 mg Start: 11/15/22 1000    Code Status: Full Code Family Communication: Updated spouse present at bedside this morning  Disposition Plan:  Level of care: Med-Surg Status is: Inpatient Remains inpatient appropriate because: IV fluids, needs further diet advancement with toleration before ready for discharge home, anticipate discharge home in 1-2 days     Consultants:  None  Procedures:  None  Antimicrobials:  None   Subjective: Patient seen examined bedside, resting comfortably.  Lying in bed.  No family present this morning.  Continues to complain of intermittent abdominal pain although lipase is now completely normal.  LFTs remain normal.  Did tolerate eating tomato soup yesterday.  Patient continues to be reliant on IV Dilaudid, refusing oral oxycodone per RN.  Will advance diet to soft diet today and discussed with patient will anticipate discharge home tomorrow.  No other specific questions or concerns at this time.  Denies headache, no  dizziness, no chest pain, no palpitations, no shortness of breath, no fever/chills/night sweats, no diarrhea, no focal weakness, no fatigue, no paresthesias.  No acute events overnight per nursing  staff.  Objective: Vitals:   11/16/22 1159 11/16/22 1321 11/16/22 2108 11/17/22 0531  BP: (!) 184/90 117/75 (!) 167/87 (!) 187/91  Pulse: (!) 105 75 79 (!) 101  Resp: Temp: 98.5 F (36.9 C)  98.3 F (36.8 C) 98.4 F (36.9 C)  TempSrc: Oral  Oral   SpO2: 96%  98% 98%  Weight:      Height:        Intake/Output Summary (Last 24 hours) at 11/17/2022 1009 Last data filed at 11/17/2022 1610 Gross per 24 hour  Intake 1136 ml  Output --  Net 1136 ml   Filed Weights   11/14/22 1324  Weight: 47.6 kg    Examination:  Physical Exam: GEN: NAD, alert and oriented x 3, wd/wn HEENT: NCAT, PERRL, EOMI, sclera clear, MMM PULM: CTAB w/o wheezes/crackles, normal respiratory effort, on room air CV: RRR w/o M/G/R GI: abd soft, mild tenderness epigastric region, nondistended, NABS, no R/G/M MSK: no peripheral edema, muscle strength globally intact 5/5 bilateral upper/lower extremities NEURO: CN II-XII intact, no focal deficits, sensation to light touch intact PSYCH: normal mood/affect Integumentary: dry/intact, no rashes or wounds    Data Reviewed: I have personally reviewed following labs and imaging studies  CBC: Recent Labs  Lab 11/14/22 1328 11/15/22 0559 11/17/22 0448  WBC 19.1* 7.2 6.0  HGB 14.1 11.1* 14.1  HCT 41.2 33.4* 41.7  MCV 88.0 91.3 88.7  PLT 303 219 PLATELET CLUMPS NOTED ON SMEAR, UNABLE TO ESTIMATE   Basic Metabolic Panel: Recent Labs  Lab 11/14/22 1328 11/14/22 1724 11/15/22 0559 11/16/22 0501 11/17/22 0448  NA 135  --  139 138 139  K 2.7*  --  4.1 3.7 3.6  CL 101  --  111 104 100  CO2 19*  --  18* 22 27  GLUCOSE 115*  --  94 80 96  BUN 17  --  13 9 7*  CREATININE 1.01*  --  0.86 0.81 0.93  CALCIUM 9.1  --  8.4* 9.1 9.5  MG  --  1.5*  --  1.8 1.7   GFR: Estimated Creatinine Clearance: 45.6 mL/min (by C-G formula based on SCr of 0.93 mg/dL). Liver Function Tests: Recent Labs  Lab 11/14/22 1328 11/16/22 0501 11/17/22 0448  AST  22 13* 17  ALT ALKPHOS 92 74 77  BILITOT 0.8 0.7 0.8  PROT 8.4* 7.1 7.9  ALBUMIN 4.3 3.6 4.1   Recent Labs  Lab 11/14/22 1328 11/15/22 0559 11/16/22 0501 11/17/22 0448  LIPASE 495* 187* 69* 47   No results for input(s): "AMMONIA" in the last 168 hours. Coagulation Profile: No results for input(s): "INR", "PROTIME" in the last 168 hours. Cardiac Enzymes: No results for input(s): "CKTOTAL", "CKMB", "CKMBINDEX", "TROPONINI" in the last 168 hours. BNP (last 3 results) No results for input(s): "PROBNP" in the last 8760 hours. HbA1C: No results for input(s): "HGBA1C" in the last 72 hours. CBG: No results for input(s): "GLUCAP" in the last 168 hours. Lipid Profile: Recent Labs    11/15/22 0559  TRIG 137   Thyroid Function Tests: No results for input(s): "TSH", "T4TOTAL", "FREET4", "T3FREE", "THYROIDAB" in the last 72 hours. Anemia Panel: No results for input(s): "VITAMINB12", "FOLATE", "FERRITIN", "TIBC", "IRON", "RETICCTPCT" in the last 72 hours. Sepsis Labs: No results for  input(s): "PROCALCITON", "LATICACIDVEN" in the last 168 hours.  No results found for this or any previous visit (from the past 240 hour(s)).       Radiology Studies: No results found.      Scheduled Meds:  enoxaparin (LOVENOX) injection  40 mg Subcutaneous Q24H   pantoprazole  40 mg Oral Daily   Continuous Infusions:  sodium chloride Stopped (11/14/22 2243)   lactated ringers 100 mL/hr at 11/17/22 0228     LOS: 2 days    Time spent: 51 minutes spent on chart review, discussion with nursing staff, consultants, updating family and interview/physical exam; more than 50% of that time was spent in counseling and/or coordination of care.    Alvira Philips [place], DO [facility] Available via Epic secure chat 7am-7pm After these hours, please refer to coverage provider listed on [email or web address] 11/17/2022, 10:09 AM

## 2022-11-18 ENCOUNTER — Other Ambulatory Visit (HOSPITAL_COMMUNITY): Payer: Self-pay

## 2022-11-18 DIAGNOSIS — K859 Acute pancreatitis without necrosis or infection, unspecified: Secondary | ICD-10-CM | POA: Diagnosis not present

## 2022-11-18 DIAGNOSIS — I10 Essential (primary) hypertension: Secondary | ICD-10-CM

## 2022-11-18 LAB — COMPREHENSIVE METABOLIC PANEL
ALT: 14 U/L (ref 0–44)
AST: 22 U/L (ref 15–41)
Albumin: 4 g/dL (ref 3.5–5.0)
Alkaline Phosphatase: 72 U/L (ref 38–126)
Anion gap: 11 (ref 5–15)
BUN: 8 mg/dL (ref 8–23)
CO2: 22 mmol/L (ref 22–32)
Calcium: 9.2 mg/dL (ref 8.9–10.3)
Chloride: 103 mmol/L (ref 98–111)
Creatinine, Ser: 0.94 mg/dL (ref 0.44–1.00)
GFR, Estimated: >60 mL/min (ref >60)
Glucose, Bld: 127 mg/dL — ABNORMAL HIGH (ref 70–99)
Potassium: 3.8 mmol/L (ref 3.5–5.1)
Sodium: 136 mmol/L (ref 135–145)
Total Bilirubin: 0.7 mg/dL (ref 0.3–1.2)
Total Protein: 7.8 g/dL (ref 6.5–8.1)

## 2022-11-18 LAB — LIPASE, BLOOD: Lipase: 38 U/L (ref 11–51)

## 2022-11-18 MED ORDER — AMLODIPINE BESYLATE 10 MG PO TABS
10.0000 mg | ORAL_TABLET | Freq: Every day | ORAL | 2 refills | Status: DC
  Filled 2022-11-18 – 2022-12-13 (×2): qty 30, 30d supply, fill #0

## 2022-11-18 MED ORDER — CARVEDILOL 12.5 MG PO TABS
12.5000 mg | ORAL_TABLET | Freq: Two times a day (BID) | ORAL | 2 refills | Status: DC
  Filled 2022-11-18 – 2022-12-13 (×2): qty 60, 30d supply, fill #0

## 2022-11-18 MED ORDER — OXYCODONE HCL 15 MG PO TABS
15.0000 mg | ORAL_TABLET | Freq: Four times a day (QID) | ORAL | 0 refills | Status: AC | PRN
  Filled 2022-11-18 (×2): qty 20, 5d supply, fill #0

## 2022-11-18 NOTE — Discharge Summary (Signed)
Physician Discharge Summary  Amarylis Rovito ZOX:[id number] DOB: 08/18/1960 DOA: 11/14/2022  PCP: [facility]  Admit date: 11/14/2022 Discharge date: 11/18/2022  Admitted From: Home Disposition: Home  Recommendations for Outpatient Follow-up:  Follow up with PCP in 1-2 weeks Patient desires referral back to her pancreatic specialist in [place], will defer to her PCP. Started on carvedilol, amlodipine for hypertension GEN  Home Health: No Equipment/Devices: None  Discharge Condition: Stable CODE STATUS: Full code Diet recommendation: Low-fat diet, heart healthy  History of present illness:  Rachel Pitts is a 62 y.o. female with past medical history significant for HTN, SVT, chronic abdominal pain, history of chronic pancreatitis who presented to med [facility] ED on 4/22 with complaints of abdominal pain associate with nausea and vomiting.  Pain is localized to the epigastric region and reports unable to keep any food/water down.  Denies NSAID or alcohol abuse.  Previous history of chronic pancreatitis but has not been hospitalized for many years.  Headache, no fever/chills, no chest pain, no palpitations, no urinary symptoms.   In the ED, temperature 98.0 F, HR 71, RR 22, BP 114/75, SpO2 100% on room air.  WBC 19.1, hemoglobin 14.1, platelets 303.  Sodium 135, potassium 2.7, chloride 101, CO2 19, glucose 115, BUN 17, creat 1.01.  AST 22, ALT 12, total bilirubin 0.8.  Lipase 495.  Urinalysis unrevealing.  CT abdomen/pelvis with abnormal edema region of the pancreatic head and duodenal C-loop, differential of acute pancreatitis versus antritis/duodenitis/ulcer disease, no free air.  Patient received IV potassium replacement, IV fluids, pain medication.  TRH was consulted for admission for further evaluation and management and patient was transferred to [facility] course:  Acute Pancreatitis Patient presented to ED with progressive abdominal pain  associated with nausea and vomiting.  Lipase elevated 495.  CT abdomen/pelvis notable for edema surrounding the pancreatic head and duodenal C-loop.  Patient with history of chronic pancreatitis in the past.  Denies NSAID, alcohol use.  Triglyceride level 137.  On IV fluids and diet was slowly advanced with toleration.  Lipase improved to 38, within normal limits at time of discharge.  Discussed with patient need to continue low-fat diet on discharge.  Patient desires rereferral to her pancreatic specialist in [place], will defer to her PCP.  Outpatient follow-up with PCP 1 week.   Hypokalemia Hypomagnesemia Etiology likely secondary to GI loss from nausea/vomiting.  Repleted.  Essential hypertension Blood pressure remained poorly controlled during her hospitalization and patient was started on amlodipine 10 mg p.o. daily, carvedilol 12.5 mg p.o. twice daily.  Outpatient follow-up with PCP.   Acute renal failure Baseline creatinine 0.78, on admission creatinine up to 1.01.  Etiology likely secondary to dehydration from acute pancreatitis as above.  Supported with IV fluid hydration.   Leukocytosis: Resolved WBC count elevated at 19.1 on admission, etiology likely reactive/hemoconcentration in the setting of dehydration from pancreatitis as above.  WBC count improved to 6.0 at time of discharge.   Chronic abdominal pain Continue home oxycodone 15 mg p.o. every 6 hours as needed   GERD Continue PPI  Discharge Diagnoses:  Principal Problem:   Acute pancreatitis Active Problems:   Hypotension   Hypokalemia   Hypomagnesemia   AKI (acute kidney injury) (HCC)   Chronic pain   Insomnia    Discharge Instructions  Discharge Instructions     Call MD for:  difficulty breathing, headache or visual disturbances   Complete by: As directed    Call MD for:  extreme fatigue   Complete by: As directed    Call MD for:  persistant dizziness or light-headedness   Complete by: As directed     Call MD for:  persistant nausea and vomiting   Complete by: As directed    Call MD for:  severe uncontrolled pain   Complete by: As directed    Call MD for:  temperature >100.4   Complete by: As directed    Diet - low sodium heart healthy   Complete by: As directed    Increase activity slowly   Complete by: As directed       Allergies as of 11/18/2022       Reactions   Aspirin Anaphylaxis   Iodine Shortness Of Breath   Ivp Dye [iodinated Contrast Media] Shortness Of Breath   Ketorolac Nausea And Vomiting   Morphine And Related Nausea And Vomiting   Tramadol Nausea And Vomiting        Medication List     STOP taking these medications    amoxicillin-clavulanate 875-125 MG tablet Commonly known as: AUGMENTIN   atenolol 50 MG tablet Commonly known as: TENORMIN   gabapentin 600 MG tablet Commonly known as: NEURONTIN   oxyCODONE-acetaminophen 5-325 MG tablet Commonly known as: Percocet   potassium chloride SA 20 MEQ tablet Commonly known as: KLOR-CON M       TAKE these medications    amLODipine 10 MG tablet Commonly known as: NORVASC Take 1 tablet (10 mg total) by mouth daily. Start taking on: November 19, 2022   carvedilol 12.5 MG tablet Commonly known as: COREG Take 1 tablet (12.5 mg total) by mouth 2 (two) times daily with a meal.   Eszopiclone 3 MG Tabs Take 3 mg by mouth at bedtime.   omeprazole 20 MG capsule Commonly known as: PRILOSEC Take 20 mg by mouth daily.   oxyCODONE 15 MG immediate release tablet Commonly known as: ROXICODONE Take 1 tablet (15 mg total) by mouth every 6 (six) hours as needed for pain. What changed: Another medication with the same name was removed. Continue taking this medication, and follow the directions you see here.   promethazine 25 MG tablet Commonly known as: PHENERGAN Take 1 tablet (25 mg total) by mouth every 6 (six) hours as needed for nausea or vomiting. What changed: when to take this   tiZANidine 4 MG  capsule Commonly known as: ZANAFLEX Take 4 mg by mouth 4 (four) times daily.        Follow-up Information     [facility]. Schedule an appointment as soon as possible for a visit in 1 week(s).   Contact information: [address] [place] [address] [phone]                Allergies  Allergen Reactions   Aspirin Anaphylaxis   Iodine Shortness Of Breath   Ivp Dye [Iodinated Contrast Media] Shortness Of Breath   Ketorolac Nausea And Vomiting   Morphine And Related Nausea And Vomiting   Tramadol Nausea And Vomiting    Consultations: None   Procedures/Studies: CT ABDOMEN PELVIS WO CONTRAST  Result Date: 11/14/2022 CLINICAL DATA:  Abdominal pain, acute, nonlocalized. Chronic pancreatitis. Recent episode of colitis. EXAM: CT ABDOMEN AND PELVIS WITHOUT CONTRAST TECHNIQUE: Multidetector CT imaging of the abdomen and pelvis was performed following the standard protocol without IV contrast. RADIATION DOSE REDUCTION: This exam was performed according to the departmental dose-optimization program which includes automated exposure control, adjustment of the mA and/or  kV according to patient size and/or use of iterative reconstruction technique. COMPARISON:  10/20/2022 FINDINGS: Lower chest: Lung bases are clear. Hepatobiliary: Liver parenchyma is normal without contrast. Previous cholecystectomy. Pancreas: Recurrent pancreatitis of the pancreatic head region with surrounding regional edema. The differential diagnosis does include gastritis/duodenitis or ulcer disease. Spleen: Normal Adrenals/Urinary Tract: Adrenal glands are normal. Kidneys are normal. No stone or hydronephrosis. Bladder is full but normal. Stomach/Bowel: Majority of the stomach appears normal. See above discussion regarding pancreatitis of the head region versus duodenitis/ulcer disease. Beyond that, the small bowel is normal. Normal appendix. Normal colon. No sign of colitis as was question  previously. Vascular/Lymphatic: Aortic atherosclerosis. No aneurysm. IVC is normal. No adenopathy. Reproductive: Previous hysterectomy.  No pelvic mass. Other: No free fluid or air. Musculoskeletal: No significant musculoskeletal finding. IMPRESSION: 1. Abnormal edema in the region of the pancreatic head and duodenal C loop. The differential diagnosis is that of acute pancreatitis versus antritis/duodenitis/ulcer disease. No free air. 2. Previous cholecystectomy and hysterectomy. 3. Aortic atherosclerosis. Aortic Atherosclerosis (ICD10-I70.0). Electronically Signed   By: Paulina Fusi M.D.   On: 11/14/2022 16:03     Subjective: Patient seen examined bedside, resting comfortably.  Lying in bed.  No family present.  Tolerating soft diet since yesterday.  Reports minimal epigastric pain.  Ready for discharge home.  Discussed if patient wants referral to her pancreatic specialist in [place] needs to follow-up with her PCP.  Discussed maintaining a low-fat diet.  Also discussed starting antihypertensives given blood pressures were poorly controlled during her hospitalization.  No other specific questions or concerns at this time.  Denies headache, no dizziness, no chest pain, no palpitations, no shortness of breath, no fever/chills/night sweats, no nausea/vomiting/diarrhea, no focal weakness, no fatigue, no paresthesias.  No acute events overnight per nursing staff.  Discharge Exam: Vitals:   11/18/22 0412 11/18/22 0728  BP: (!) 168/92 (!) 155/90  Pulse: 91 95  Resp: 18   Temp: 97.9 F (36.6 C) 98.3 F (36.8 C)  SpO2: 98% 95%   Vitals:   11/17/22 2239 11/18/22 0005 11/18/22 0412 11/18/22 0728  BP: (!) 185/104 (!) 169/108 (!) 168/92 (!) 155/90  Pulse: 78 89 91 95  Resp:  18 18   Temp:  97.9 F (36.6 C) 97.9 F (36.6 C) 98.3 F (36.8 C)  TempSrc:    Oral  SpO2:  97% 98% 95%  Weight:      Height:        Physical Exam: GEN: NAD, alert and oriented x 3, chronically ill appearance, appears  older than stated age HEENT: NCAT, PERRL, EOMI, sclera clear, MMM PULM: CTAB w/o wheezes/crackles, normal respiratory effort, on room air CV: RRR w/o M/G/R GI: abd soft, mild epigastric tenderness to palpation, nondistended, NABS, no R/G/M MSK: no peripheral edema, muscle strength globally intact 5/5 bilateral upper/lower extremities NEURO: CN II-XII intact, no focal deficits, sensation to light touch intact PSYCH: Depressed mood, flat affect Integumentary: dry/intact, no rashes or wounds    The results of significant diagnostics from this hospitalization (including imaging, microbiology, ancillary and laboratory) are listed below for reference.     Microbiology: No results found for this or any previous visit (from the past 240 hour(s)).   Labs: BNP (last 3 results) No results for input(s): "BNP" in the last 8760 hours. Basic Metabolic Panel: Recent Labs  Lab 11/14/22 1328 11/14/22 1724 11/15/22 0559 11/16/22 0501 11/17/22 0448 11/18/22 0450  NA 135  --  139 138 139 136  K 2.7*  --  4.1 3.7 3.6 3.8  CL 101  --  111 104 100 103  CO2 19*  --  18* 22 27 22   GLUCOSE 115*  --  94 80 96 127*  BUN 17  --  13 9 7* 8  CREATININE 1.01*  --  0.86 0.81 0.93 0.94  CALCIUM 9.1  --  8.4* 9.1 9.5 9.2  MG  --  1.5*  --  1.8 1.7  --    Liver Function Tests: Recent Labs  Lab 11/14/22 1328 11/16/22 0501 11/17/22 0448 11/18/22 0450  AST 22 13* 17 22  ALT 12 10 10 14   ALKPHOS 92 74 77 72  BILITOT 0.8 0.7 0.8 0.7  PROT 8.4* 7.1 7.9 7.8  ALBUMIN 4.3 3.6 4.1 4.0   Recent Labs  Lab 11/14/22 1328 11/15/22 0559 11/16/22 0501 11/17/22 0448 11/18/22 0450  LIPASE 495* 187* 69* 47 38   No results for input(s): "AMMONIA" in the last 168 hours. CBC: Recent Labs  Lab 11/14/22 1328 11/15/22 0559 11/17/22 0448  WBC 19.1* 7.2 6.0  HGB 14.1 11.1* 14.1  HCT 41.2 33.4* 41.7  MCV 88.0 91.3 88.7  PLT 303 219 PLATELET CLUMPS NOTED ON SMEAR, UNABLE TO ESTIMATE   Cardiac Enzymes: No  results for input(s): "CKTOTAL", "CKMB", "CKMBINDEX", "TROPONINI" in the last 168 hours. BNP: Invalid input(s): "POCBNP" CBG: No results for input(s): "GLUCAP" in the last 168 hours. D-Dimer No results for input(s): "DDIMER" in the last 72 hours. Hgb A1c No results for input(s): "HGBA1C" in the last 72 hours. Lipid Profile No results for input(s): "CHOL", "HDL", "LDLCALC", "TRIG", "CHOLHDL", "LDLDIRECT" in the last 72 hours. Thyroid function studies No results for input(s): "TSH", "T4TOTAL", "T3FREE", "THYROIDAB" in the last 72 hours.  Invalid input(s): "FREET3" Anemia work up No results for input(s): "VITAMINB12", "FOLATE", "FERRITIN", "TIBC", "IRON", "RETICCTPCT" in the last 72 hours. Urinalysis    Component Value Date/Time   COLORURINE STRAW (A) 11/14/2022 1612   APPEARANCEUR CLEAR 11/14/2022 1612   LABSPEC >=1.030 11/14/2022 1612   PHURINE 7.0 11/14/2022 1612   GLUCOSEU NEGATIVE 11/14/2022 1612   HGBUR TRACE (A) 11/14/2022 1612   BILIRUBINUR NEGATIVE 11/14/2022 1612   KETONESUR NEGATIVE 11/14/2022 1612   PROTEINUR NEGATIVE 11/14/2022 1612   NITRITE NEGATIVE 11/14/2022 1612   LEUKOCYTESUR NEGATIVE 11/14/2022 1612   Sepsis Labs Recent Labs  Lab 11/14/22 1328 11/15/22 0559 11/17/22 0448  WBC 19.1* 7.2 6.0   Microbiology No results found for this or any previous visit (from the past 240 hour(s)).   Time coordinating discharge: Over 30 minutes  SIGNED:   Alvira Philips [place], DO  [facility] 11/18/2022, 9:35 AM

## 2022-11-18 NOTE — Plan of Care (Signed)

## 2022-12-05 IMAGING — CT CT ABD-PELV W/O CM
2 of 4 series · 16 of 46 positions shown, 18 images · non-contrast
Comparison: None.

CLINICAL DATA: Acute left-sided abdominal pain.  Rectal bleeding.

EXAM:
CT ABDOMEN AND PELVIS WITHOUT CONTRAST
TECHNIQUE: Multidetector CT imaging of the abdomen and pelvis was performed
following the standard protocol without IV contrast.

[Series 2: axial st · axial · 0.84mm/px · z∈[-422,-32]mm · 13 of 86 slices shown, 15 images]
[im 4/86  soft-tissue]
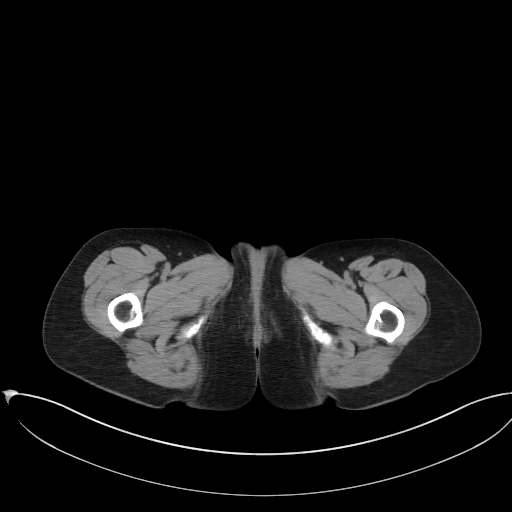
[im 4/86  bone]
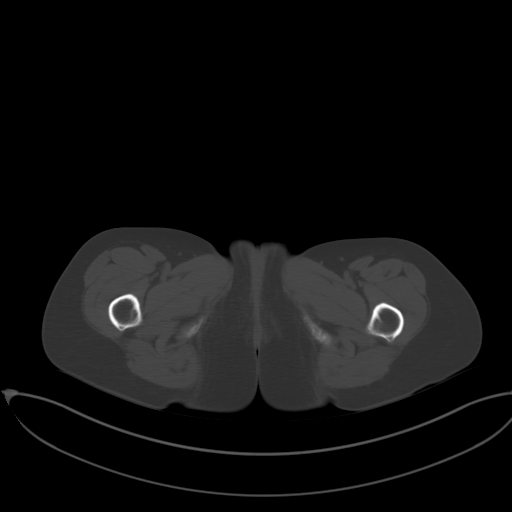
[im 11/86  soft-tissue]
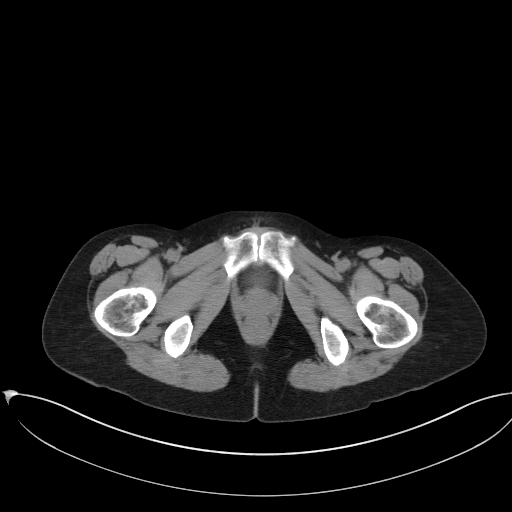
[im 18/86  soft-tissue]
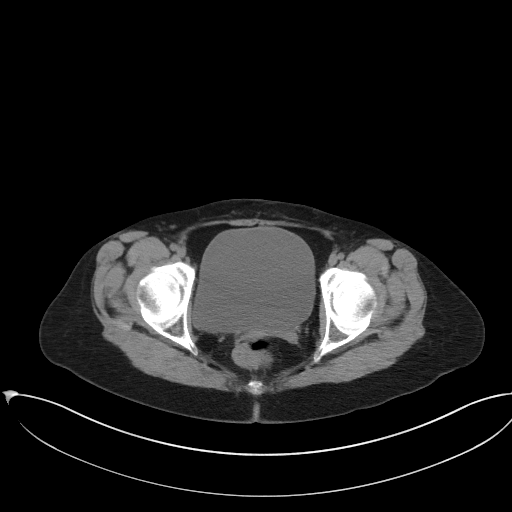
[im 24/86  soft-tissue]
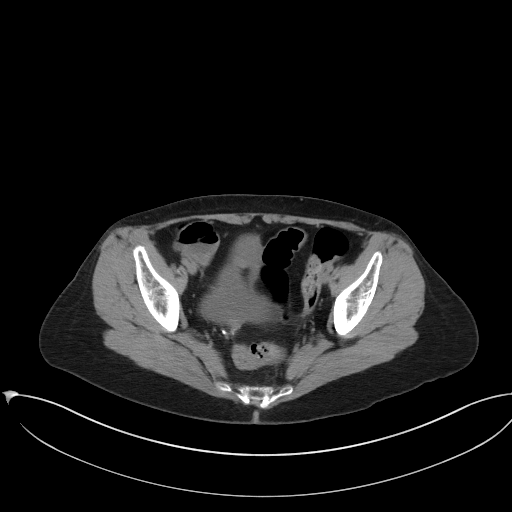
[im 31/86  soft-tissue]
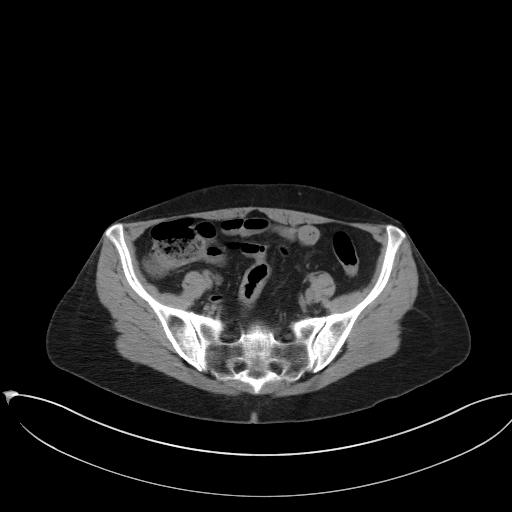
[im 38/86  soft-tissue]
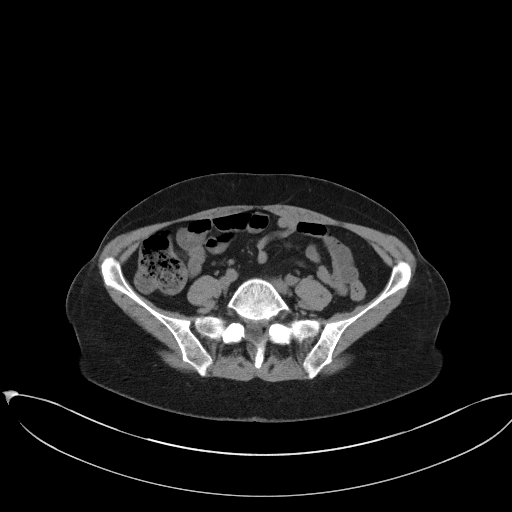
[im 45/86  soft-tissue]
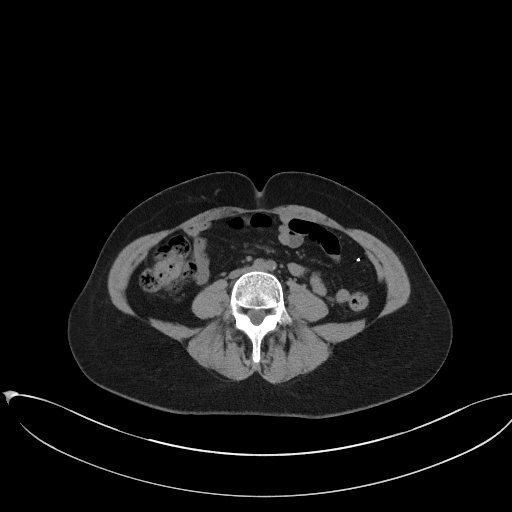
[im 48/86  soft-tissue]
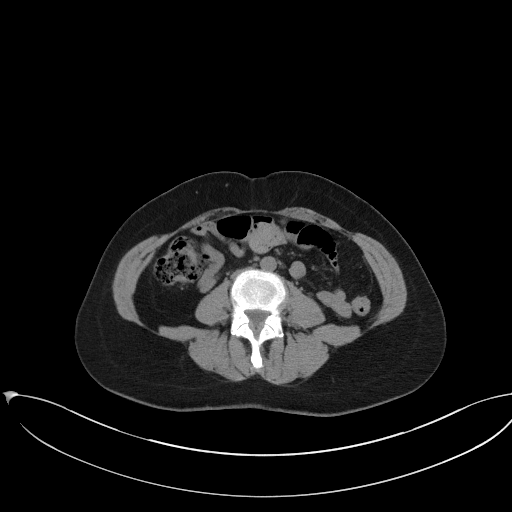
[im 55/86  soft-tissue]
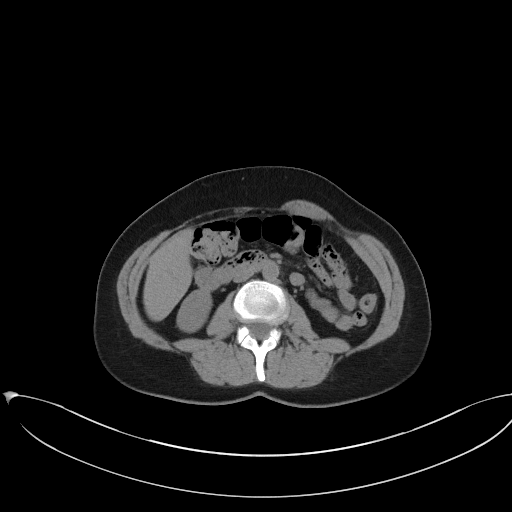
[im 55/86  bone]
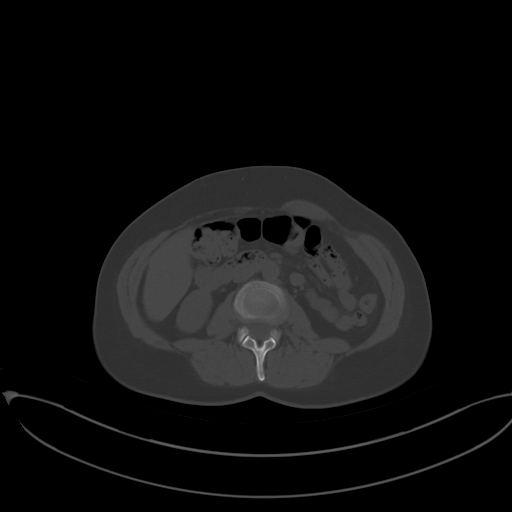
[im 62/86  soft-tissue]
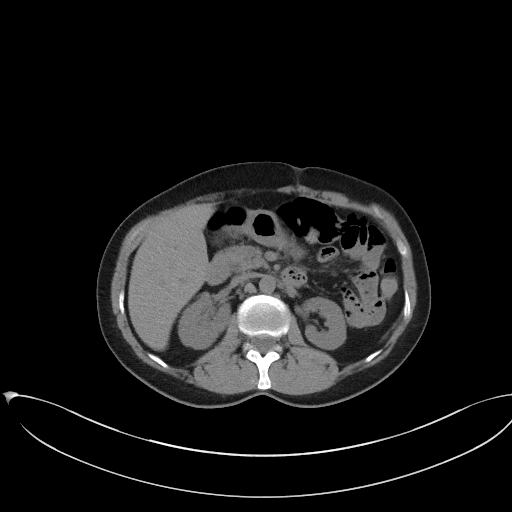
[im 69/86  soft-tissue]
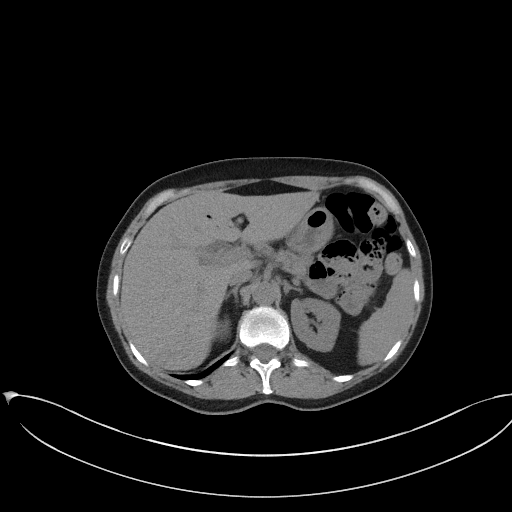
[im 75/86  soft-tissue]
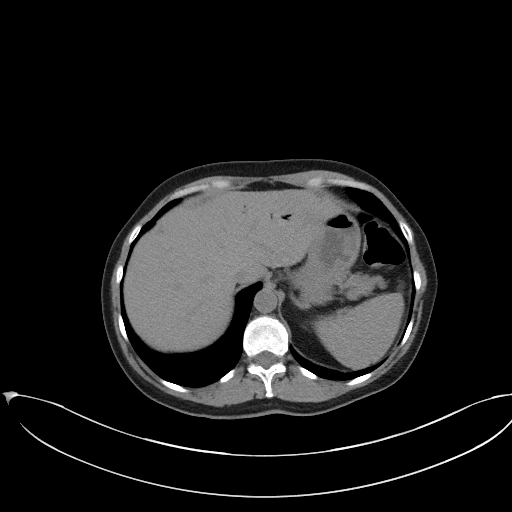
[im 82/86  soft-tissue]
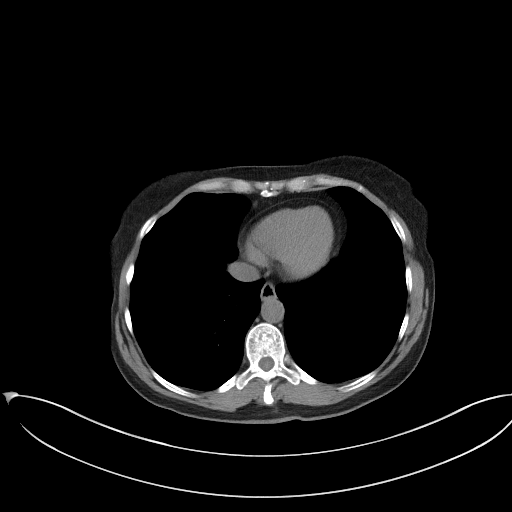

[Series 5: coronal st · coronal · 0.70mm/px · 3 of 78 slices shown]
[im 26/78  soft-tissue]
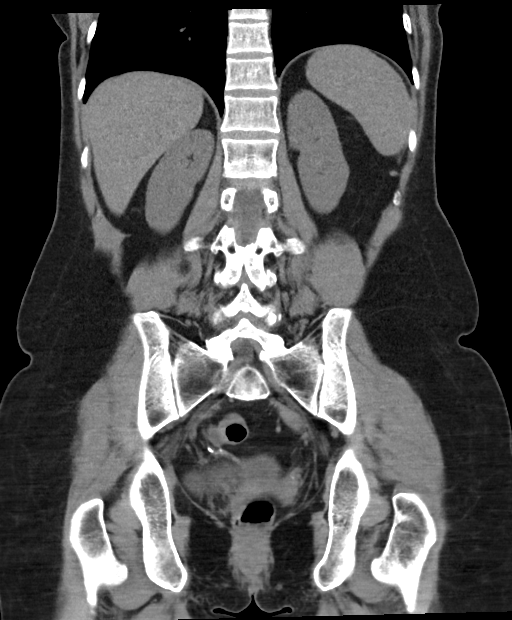
[im 35/78  soft-tissue]
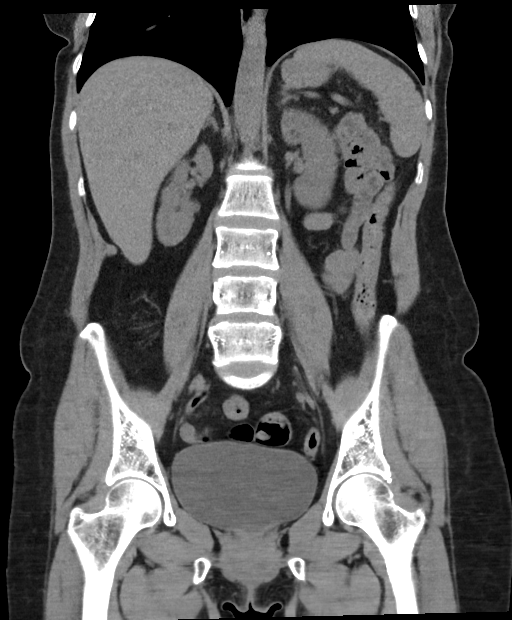
[im 43/78  soft-tissue]
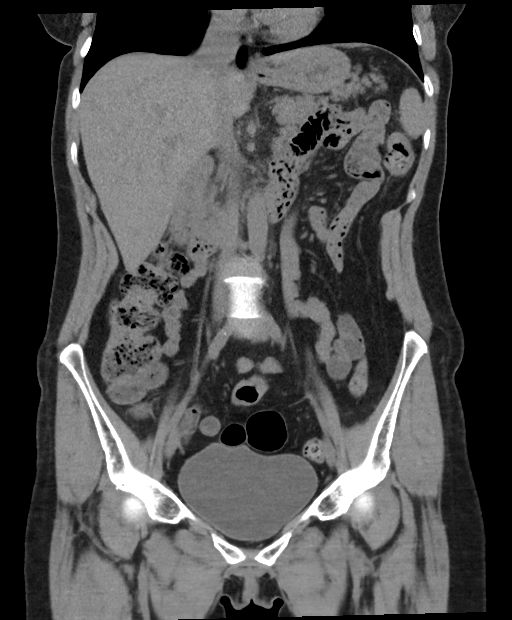

[16 of 46 positions shown; findings below may reference images not displayed]

FINDINGS: Lower chest: No acute abnormality.

Hepatobiliary: Status post cholecystectomy. Left hepatic pneumobilia
is noted most consistent with post cholecystectomy status. No
biliary dilatation is noted. Liver is unremarkable on these
unenhanced images.

Pancreas: Unremarkable. No pancreatic ductal dilatation or
surrounding inflammatory changes.

Spleen: Normal in size without focal abnormality.

Adrenals/Urinary Tract: Adrenal glands are unremarkable. Kidneys are
normal, without renal calculi, focal lesion, or hydronephrosis.
Bladder is unremarkable.

Stomach/Bowel: Stomach is within normal limits. Appendix appears
normal. No evidence of bowel wall thickening, distention, or
inflammatory changes.

Vascular/Lymphatic: No significant vascular findings are present. No
enlarged abdominal or pelvic lymph nodes.

Reproductive: Status post hysterectomy. No adnexal masses.

Other: No abdominal wall hernia or abnormality. No abdominopelvic
ascites.

Musculoskeletal: No acute or significant osseous findings.
IMPRESSION: Status post cholecystectomy. No acute abnormality seen in the
abdomen or pelvis.

## 2022-12-07 ENCOUNTER — Encounter (HOSPITAL_BASED_OUTPATIENT_CLINIC_OR_DEPARTMENT_OTHER): Payer: Self-pay | Admitting: Emergency Medicine

## 2022-12-07 ENCOUNTER — Other Ambulatory Visit (HOSPITAL_BASED_OUTPATIENT_CLINIC_OR_DEPARTMENT_OTHER): Payer: Self-pay

## 2022-12-07 ENCOUNTER — Other Ambulatory Visit: Payer: Self-pay

## 2022-12-07 ENCOUNTER — Emergency Department (HOSPITAL_BASED_OUTPATIENT_CLINIC_OR_DEPARTMENT_OTHER): Payer: 59

## 2022-12-07 ENCOUNTER — Emergency Department (HOSPITAL_BASED_OUTPATIENT_CLINIC_OR_DEPARTMENT_OTHER)
Admission: EM | Admit: 2022-12-07 | Discharge: 2022-12-07 | Disposition: A | Discharge status: Home / Self Care | Payer: 59 | Attending: Emergency Medicine | Admitting: Emergency Medicine

## 2022-12-07 DIAGNOSIS — I10 Essential (primary) hypertension: Secondary | ICD-10-CM | POA: Diagnosis not present

## 2022-12-07 DIAGNOSIS — R1013 Epigastric pain: Secondary | ICD-10-CM | POA: Insufficient documentation

## 2022-12-07 DIAGNOSIS — Z8541 Personal history of malignant neoplasm of cervix uteri: Secondary | ICD-10-CM | POA: Insufficient documentation

## 2022-12-07 DIAGNOSIS — R000 Tachycardia, unspecified: Secondary | ICD-10-CM | POA: Diagnosis not present

## 2022-12-07 DIAGNOSIS — R1011 Right upper quadrant pain: Secondary | ICD-10-CM | POA: Diagnosis not present

## 2022-12-07 DIAGNOSIS — R112 Nausea with vomiting, unspecified: Secondary | ICD-10-CM | POA: Diagnosis present

## 2022-12-07 DIAGNOSIS — E876 Hypokalemia: Secondary | ICD-10-CM | POA: Insufficient documentation

## 2022-12-07 DIAGNOSIS — Z79899 Other long term (current) drug therapy: Secondary | ICD-10-CM | POA: Insufficient documentation

## 2022-12-07 LAB — URINALYSIS, ROUTINE W REFLEX MICROSCOPIC
Bilirubin Urine: NEGATIVE
Glucose, UA: NEGATIVE mg/dL
Ketones, ur: NEGATIVE mg/dL
Nitrite: NEGATIVE
Protein, ur: NEGATIVE mg/dL
Specific Gravity, Urine: 1.015 (ref 1.005–1.030)
pH: 5.5 (ref 5.0–8.0)

## 2022-12-07 LAB — COMPREHENSIVE METABOLIC PANEL
ALT: 17 U/L (ref 0–44)
AST: 23 U/L (ref 15–41)
Albumin: 4.3 g/dL (ref 3.5–5.0)
Alkaline Phosphatase: 83 U/L (ref 38–126)
Anion gap: 12 (ref 5–15)
BUN: 13 mg/dL (ref 8–23)
CO2: 19 mmol/L — ABNORMAL LOW (ref 22–32)
Calcium: 9 mg/dL (ref 8.9–10.3)
Chloride: 108 mmol/L (ref 98–111)
Creatinine, Ser: 0.81 mg/dL (ref 0.44–1.00)
GFR, Estimated: >60 mL/min (ref >60)
Glucose, Bld: 106 mg/dL — ABNORMAL HIGH (ref 70–99)
Potassium: 3 mmol/L — ABNORMAL LOW (ref 3.5–5.1)
Sodium: 139 mmol/L (ref 135–145)
Total Bilirubin: 0.6 mg/dL (ref 0.3–1.2)
Total Protein: 8.3 g/dL — ABNORMAL HIGH (ref 6.5–8.1)

## 2022-12-07 LAB — CBC
HCT: 38.8 % (ref 36.0–46.0)
Hemoglobin: 13.4 g/dL (ref 12.0–15.0)
MCH: 30.2 pg (ref 26.0–34.0)
MCHC: 34.5 g/dL (ref 30.0–36.0)
MCV: 87.4 fL (ref 80.0–100.0)
Platelets: 233 10*3/uL (ref 150–400)
RBC: 4.44 MIL/uL (ref 3.87–5.11)
RDW: 12.1 % (ref 11.5–15.5)
WBC: 9.9 10*3/uL (ref 4.0–10.5)
nRBC: 0 % (ref 0.0–0.2)

## 2022-12-07 LAB — LIPASE, BLOOD: Lipase: 33 U/L (ref 11–51)

## 2022-12-07 LAB — URINALYSIS, MICROSCOPIC (REFLEX)

## 2022-12-07 MED ORDER — HEPARIN SOD (PORK) LOCK FLUSH 100 UNIT/ML IV SOLN
500.0000 [IU] | Freq: Once | INTRAVENOUS | Status: AC
  Administered 2022-12-07: 500 [IU]
  Filled 2022-12-07: qty 5

## 2022-12-07 MED ORDER — SODIUM CHLORIDE 0.9 % IV BOLUS
1000.0000 mL | Freq: Once | INTRAVENOUS | Status: AC
  Administered 2022-12-07: 1000 mL via INTRAVENOUS

## 2022-12-07 MED ORDER — POTASSIUM CHLORIDE CRYS ER 20 MEQ PO TBCR
40.0000 meq | EXTENDED_RELEASE_TABLET | Freq: Once | ORAL | Status: AC
  Administered 2022-12-07: 40 meq via ORAL
  Filled 2022-12-07: qty 2

## 2022-12-07 MED ORDER — ONDANSETRON HCL 4 MG/2ML IJ SOLN
4.0000 mg | Freq: Once | INTRAMUSCULAR | Status: AC
  Administered 2022-12-07: 4 mg via INTRAVENOUS
  Filled 2022-12-07: qty 2

## 2022-12-07 MED ORDER — HYDROMORPHONE HCL 1 MG/ML IJ SOLN
1.0000 mg | Freq: Once | INTRAMUSCULAR | Status: AC
  Administered 2022-12-07: 1 mg via INTRAVENOUS
  Filled 2022-12-07: qty 1

## 2022-12-07 MED ORDER — ONDANSETRON HCL 4 MG PO TABS
4.0000 mg | ORAL_TABLET | Freq: Three times a day (TID) | ORAL | 0 refills | Status: DC | PRN
  Filled 2022-12-07: qty 20, 7d supply, fill #0

## 2022-12-07 NOTE — Discharge Instructions (Signed)
You were evaluated today for abdominal pain, nausea, and vomiting.  Your workup was reassuring that there is no recurrent pancreatitis at this time.  You may have a worsening of your chronic pain.  I have prescribed Zofran for nausea to be taken as directed.  Please follow-up with your primary care team for further evaluation and management as needed.  If you develop any life-threatening symptoms please return to the emergency department.

## 2022-12-07 NOTE — ED Notes (Signed)
Deaccessed port

## 2022-12-07 NOTE — ED Notes (Signed)
Pt declines IV start and requests port access.  Alex RN will access port for pt

## 2022-12-07 NOTE — ED Provider Notes (Signed)
[place] [facility] Provider Note   CSN: [id number] Arrival date & time: 12/07/22  1350     History  Chief Complaint  Patient presents with   Abdominal Pain    RUQ    Rachel Pitts is a 62 y.o. female.  Patient presents to the emergency department complaining of epigastric abdominal pain.  Patient states she has pain in the epigastric region, also right upper quadrant, which radiates towards her back.  She was admitted 2 weeks ago due to acute on chronic pancreatitis.  She states the pain feels similar to the pain she had at that time.  She does endorse nausea and vomiting.  She states the pain is been back since yesterday.  She denies chest pain, shortness of breath, urinary symptoms, vaginal bleeding or discharge.  Past medical history otherwise significant for hypertension, GERD, irregular heart rate, cervical cancer, cholecystectomy, abdominal hysterectomy  HPI     Home Medications Prior to Admission medications   Medication Sig Start Date End Date Taking? Authorizing Provider  ondansetron (ZOFRAN) 4 MG tablet Take 1 tablet (4 mg total) by mouth every 8 (eight) hours as needed for nausea or vomiting. 12/07/22  Yes Barrie Dunker B, PA-C  amLODipine (NORVASC) 10 MG tablet Take 1 tablet (10 mg total) by mouth daily. 11/19/22 02/17/23  [place], Alvira Philips, DO  carvedilol (COREG) 12.5 MG tablet Take 1 tablet (12.5 mg total) by mouth 2 (two) times daily with a meal. 11/18/22 02/16/23  [place], Eric J, DO  Eszopiclone 3 MG TABS Take 3 mg by mouth at bedtime. 10/28/22   [provider]  omeprazole (PRILOSEC) 20 MG capsule Take 20 mg by mouth daily. 08/08/17   [provider]  oxyCODONE (ROXICODONE) 15 MG immediate release tablet Take 1 tablet (15 mg total) by mouth every 6 (six) hours as needed for pain. 11/18/22   [place], Alvira Philips, DO  promethazine (PHENERGAN) 25 MG tablet Take 1 tablet (25 mg total) by mouth every 6 (six) hours as needed for  nausea or vomiting. Patient taking differently: Take 25 mg by mouth every 4 (four) hours as needed for nausea or vomiting. 10/12/22   Curatolo, Adam, DO  tiZANidine (ZANAFLEX) 4 MG capsule Take 4 mg by mouth 4 (four) times daily.    [provider]      Allergies    Aspirin, Iodine, Ivp dye [iodinated contrast media], Ketorolac, Morphine and codeine, and Tramadol    Review of Systems   Review of Systems  Physical Exam Updated Vital Signs BP (!) 142/85   Pulse 93   Temp 98 F (36.7 C) (Oral)   Resp 18   Wt 49.9 kg   SpO2 98%   BMI 21.48 kg/m  Physical Exam Vitals and nursing note reviewed.  Constitutional:      General: She is in acute distress.     Appearance: She is well-developed.  HENT:     Head: Normocephalic and atraumatic.  Eyes:     Conjunctiva/sclera: Conjunctivae normal.  Cardiovascular:     Rate and Rhythm: Regular rhythm. Tachycardia present.     Heart sounds: No murmur heard. Pulmonary:     Effort: Pulmonary effort is normal. No respiratory distress.     Breath sounds: Normal breath sounds.  Abdominal:     Palpations: Abdomen is soft.     Tenderness: There is abdominal tenderness in the right upper quadrant and epigastric area.  Musculoskeletal:        General:  No swelling.     Cervical back: Neck supple.  Skin:    General: Skin is warm and dry.     Capillary Refill: Capillary refill takes less than 2 seconds.  Neurological:     Mental Status: She is alert.  Psychiatric:        Mood and Affect: Mood normal.     ED Results / Procedures / Treatments   Labs (all labs ordered are listed, but only abnormal results are displayed) Labs Reviewed  COMPREHENSIVE METABOLIC PANEL - Abnormal; Notable for the following components:      Result Value   Potassium 3.0 (*)    CO2 19 (*)    Glucose, Bld 106 (*)    Total Protein 8.3 (*)    All other components within normal limits  URINALYSIS, ROUTINE W REFLEX MICROSCOPIC - Abnormal; Notable for the  following components:   Hgb urine dipstick TRACE (*)    Leukocytes,Ua SMALL (*)    All other components within normal limits  URINALYSIS, MICROSCOPIC (REFLEX) - Abnormal; Notable for the following components:   Bacteria, UA RARE (*)    All other components within normal limits  LIPASE, BLOOD  CBC    EKG None  Radiology CT ABDOMEN PELVIS WO CONTRAST  Result Date: 12/07/2022 CLINICAL DATA:  Acute severe pancreatitis. Right upper quadrant pain radiating to the back. Nausea and vomiting. Hospital discharge 2 weeks ago. EXAM: CT ABDOMEN AND PELVIS WITHOUT CONTRAST TECHNIQUE: Multidetector CT imaging of the abdomen and pelvis was performed following the standard protocol without IV contrast. RADIATION DOSE REDUCTION: This exam was performed according to the departmental dose-optimization program which includes automated exposure control, adjustment of the mA and/or kV according to patient size and/or use of iterative reconstruction technique. COMPARISON:  11/14/2022 and 10/12/2022 FINDINGS: Lower chest: Lung bases are clear. Hepatobiliary: No focal liver abnormality is seen. Status post cholecystectomy. No biliary dilatation. Pancreas: Mild infiltration around the pancreas. Inflammatory changes demonstrates significant improvement since the previous study. No residual loculated collections. No pancreatic ductal dilatation. Spleen: Normal in size without focal abnormality. Adrenals/Urinary Tract: Adrenal glands are unremarkable. Kidneys are normal, without renal calculi, focal lesion, or hydronephrosis. Bladder is unremarkable. Stomach/Bowel: Stomach, small bowel, and colon are not abnormally distended. No wall thickening or inflammatory changes. Appendix is normal. Vascular/Lymphatic: No significant vascular findings are present. No enlarged abdominal or pelvic lymph nodes. Reproductive: No pelvic masses. Other: No free air or free fluid in the abdomen. Small periumbilical hernia containing fat.  Musculoskeletal: No acute or significant osseous findings. IMPRESSION: 1. Significant improvement of pancreatitis changes since prior study. Minimal residual stranding around the pancreas. No loculated collections. 2. No evidence of bowel obstruction or inflammation. 3. Small periumbilical hernia containing fat. Electronically Signed   By: Burman Nieves M.D.   On: 12/07/2022 14:50    Procedures Procedures    Medications Ordered in ED Medications  heparin lock flush 100 unit/mL (has no administration in time range)  potassium chloride SA (KLOR-CON M) CR tablet 40 mEq (has no administration in time range)  HYDROmorphone (DILAUDID) injection 1 mg (1 mg Intravenous Given 12/07/22 1602)  ondansetron (ZOFRAN) injection 4 mg (4 mg Intravenous Given 12/07/22 1536)  sodium chloride 0.9 % bolus 1,000 mL (0 mLs Intravenous Stopped 12/07/22 1712)  HYDROmorphone (DILAUDID) injection 1 mg (1 mg Intravenous Given 12/07/22 1711)    ED Course/ Medical Decision Making/ A&P  Medical Decision Making Amount and/or Complexity of Data Reviewed Labs: ordered. Radiology: ordered.  Risk Prescription drug management.   This patient presents to the ED for concern of abdominal pain, this involves an extensive number of treatment options, and is a complaint that carries with it a high risk of complications and morbidity.  The differential diagnosis includes acute on chronic pancreatitis, appendicitis, colitis, gastritis, GERD, others   Co morbidities that complicate the patient evaluation  History of pancreatitis   Additional history obtained:  Additional history obtained from family at bedside External records from outside source obtained and reviewed including discharge summary from April 26.  At admission patient had elevated lipase at 495.   Lab Tests:  I Ordered, and personally interpreted labs.  The pertinent results include: Lipase 33, unremarkable CBC, potassium 3.0,  grossly unremarkable UA with trace hemoglobin, small leukocytes, small bacteria   Imaging Studies ordered:  I ordered imaging studies including CT abdomen pelvis without contrast (patient with contrast allergy) I independently visualized and interpreted imaging which showed  1. Significant improvement of pancreatitis changes since prior  study. Minimal residual stranding around the pancreas. No loculated  collections.  2. No evidence of bowel obstruction or inflammation.  3. Small periumbilical hernia containing fat   I agree with the radiologist interpretation    Problem List / ED Course / Critical interventions / Medication management   I ordered medication including Dilaudid for pain, Zofran for nausea, saline bolus for fluid resuscitation, potassium for hypokalemia Reevaluation of the patient after these medicines showed that the patient improved I have reviewed the patients home medicines and have made adjustments as needed   Test / Admission - Considered:  Patient with CT findings significant for improvement of previously diagnosed acute pancreatitis.  No loculated collections.  Minimal residual stranding noted around the pancreas.  Patient's lipase is 33 today.  At time of last admission it was 497.  I do not think patient has recurrent pancreatitis today.  No acute findings on CT scan to explain patient's pain.  Patient does have chronic pain and is on outpatient oxycodone.  She had 120 tablets of oxycodone 15 filled on May 8.  Question if this may be a worsening of patient's chronic pain instead of acute abdominal pain.  Patient able to drink fluid without emesis.  Will provide Zofran prescription to be used as needed.  Plan to discharge patient home at this time.  Vitals are normal.  She does have a mild hypokalemia.  Will recommend potassium supplementation.  Patient will follow-up as needed with her primary care provider for further discussion about pain management.  Return  precautions provided.         Final Clinical Impression(s) / ED Diagnoses Final diagnoses:  Epigastric pain  Nausea and vomiting, unspecified vomiting type  Hypokalemia    Rx / DC Orders ED Discharge Orders          Ordered    ondansetron (ZOFRAN) 4 MG tablet  Every 8 hours PRN        12/07/22 1716              Pamala Duffel 12/07/22 1719    Virgina Norfolk, DO 12/08/22 0732

## 2022-12-07 NOTE — ED Notes (Signed)
Up to get urine and then to CT . Perpt she was just d/c from [facility] 2 weeks ago for pancreatitis

## 2022-12-07 NOTE — ED Triage Notes (Signed)
Recurrent RUQ pain radiating to back , Hx pancreatitis and admission 2 week ago for same . [place] . Obvious distress

## 2022-12-09 ENCOUNTER — Other Ambulatory Visit: Payer: Self-pay

## 2022-12-09 ENCOUNTER — Emergency Department (HOSPITAL_BASED_OUTPATIENT_CLINIC_OR_DEPARTMENT_OTHER)
Admission: EM | Admit: 2022-12-09 | Discharge: 2022-12-09 | Disposition: A | Discharge status: Home / Self Care | Payer: 59 | Attending: Emergency Medicine | Admitting: Emergency Medicine

## 2022-12-09 ENCOUNTER — Other Ambulatory Visit (HOSPITAL_BASED_OUTPATIENT_CLINIC_OR_DEPARTMENT_OTHER): Payer: Self-pay

## 2022-12-09 ENCOUNTER — Encounter (HOSPITAL_BASED_OUTPATIENT_CLINIC_OR_DEPARTMENT_OTHER): Payer: Self-pay

## 2022-12-09 DIAGNOSIS — R000 Tachycardia, unspecified: Secondary | ICD-10-CM | POA: Diagnosis not present

## 2022-12-09 DIAGNOSIS — R944 Abnormal results of kidney function studies: Secondary | ICD-10-CM | POA: Diagnosis not present

## 2022-12-09 DIAGNOSIS — R112 Nausea with vomiting, unspecified: Secondary | ICD-10-CM | POA: Diagnosis present

## 2022-12-09 DIAGNOSIS — D72829 Elevated white blood cell count, unspecified: Secondary | ICD-10-CM | POA: Diagnosis not present

## 2022-12-09 DIAGNOSIS — G8929 Other chronic pain: Secondary | ICD-10-CM | POA: Diagnosis not present

## 2022-12-09 DIAGNOSIS — R197 Diarrhea, unspecified: Secondary | ICD-10-CM | POA: Diagnosis not present

## 2022-12-09 DIAGNOSIS — R1012 Left upper quadrant pain: Secondary | ICD-10-CM | POA: Insufficient documentation

## 2022-12-09 DIAGNOSIS — Z79899 Other long term (current) drug therapy: Secondary | ICD-10-CM | POA: Diagnosis not present

## 2022-12-09 DIAGNOSIS — R1013 Epigastric pain: Secondary | ICD-10-CM | POA: Insufficient documentation

## 2022-12-09 LAB — URINALYSIS, ROUTINE W REFLEX MICROSCOPIC
Bilirubin Urine: NEGATIVE
Glucose, UA: NEGATIVE mg/dL
Ketones, ur: NEGATIVE mg/dL
Leukocytes,Ua: NEGATIVE
Nitrite: NEGATIVE
Protein, ur: NEGATIVE mg/dL
Specific Gravity, Urine: 1.02 (ref 1.005–1.030)
pH: 5.5 (ref 5.0–8.0)

## 2022-12-09 LAB — COMPREHENSIVE METABOLIC PANEL
ALT: 21 U/L (ref 0–44)
AST: 34 U/L (ref 15–41)
Albumin: 4.9 g/dL (ref 3.5–5.0)
Alkaline Phosphatase: 92 U/L (ref 38–126)
Anion gap: 17 — ABNORMAL HIGH (ref 5–15)
BUN: 12 mg/dL (ref 8–23)
CO2: 21 mmol/L — ABNORMAL LOW (ref 22–32)
Calcium: 10.1 mg/dL (ref 8.9–10.3)
Chloride: 103 mmol/L (ref 98–111)
Creatinine, Ser: 1.14 mg/dL — ABNORMAL HIGH (ref 0.44–1.00)
GFR, Estimated: 55 mL/min — ABNORMAL LOW (ref >60)
Glucose, Bld: 167 mg/dL — ABNORMAL HIGH (ref 70–99)
Potassium: 3.8 mmol/L (ref 3.5–5.1)
Sodium: 141 mmol/L (ref 135–145)
Total Bilirubin: 0.8 mg/dL (ref 0.3–1.2)
Total Protein: 9.3 g/dL — ABNORMAL HIGH (ref 6.5–8.1)

## 2022-12-09 LAB — URINALYSIS, MICROSCOPIC (REFLEX)

## 2022-12-09 LAB — CBC
HCT: 45.4 % (ref 36.0–46.0)
Hemoglobin: 15.7 g/dL — ABNORMAL HIGH (ref 12.0–15.0)
MCH: 30.3 pg (ref 26.0–34.0)
MCHC: 34.6 g/dL (ref 30.0–36.0)
MCV: 87.5 fL (ref 80.0–100.0)
Platelets: 383 10*3/uL (ref 150–400)
RBC: 5.19 MIL/uL — ABNORMAL HIGH (ref 3.87–5.11)
RDW: 12.5 % (ref 11.5–15.5)
WBC: 18.2 10*3/uL — ABNORMAL HIGH (ref 4.0–10.5)
nRBC: 0 % (ref 0.0–0.2)

## 2022-12-09 LAB — LIPASE, BLOOD: Lipase: 35 U/L (ref 11–51)

## 2022-12-09 LAB — MAGNESIUM: Magnesium: 1.3 mg/dL — ABNORMAL LOW (ref 1.7–2.4)

## 2022-12-09 MED ORDER — SODIUM CHLORIDE 0.9 % IV BOLUS
1000.0000 mL | Freq: Once | INTRAVENOUS | Status: AC
  Administered 2022-12-09: 1000 mL via INTRAVENOUS

## 2022-12-09 MED ORDER — HYDROMORPHONE HCL 1 MG/ML IJ SOLN
1.0000 mg | Freq: Once | INTRAMUSCULAR | Status: AC
  Administered 2022-12-09: 1 mg via INTRAVENOUS
  Filled 2022-12-09: qty 1

## 2022-12-09 MED ORDER — PROMETHAZINE HCL 25 MG PO TABS
25.0000 mg | ORAL_TABLET | Freq: Four times a day (QID) | ORAL | 0 refills | Status: DC | PRN
  Filled 2022-12-09: qty 10, 3d supply, fill #0

## 2022-12-09 MED ORDER — OXYCODONE HCL 5 MG PO TABS
15.0000 mg | ORAL_TABLET | Freq: Once | ORAL | Status: AC
  Administered 2022-12-09: 15 mg via ORAL
  Filled 2022-12-09: qty 3

## 2022-12-09 MED ORDER — MAGNESIUM SULFATE 2 GM/50ML IV SOLN
2.0000 g | Freq: Once | INTRAVENOUS | Status: AC
  Administered 2022-12-09: 2 g via INTRAVENOUS
  Filled 2022-12-09: qty 50

## 2022-12-09 MED ORDER — ONDANSETRON HCL 4 MG/2ML IJ SOLN
4.0000 mg | Freq: Once | INTRAMUSCULAR | Status: AC
  Administered 2022-12-09: 4 mg via INTRAVENOUS
  Filled 2022-12-09: qty 2

## 2022-12-09 NOTE — ED Triage Notes (Signed)
Patient has had vomiting and abd pain since Sunday. She was seen here yesterday.

## 2022-12-09 NOTE — ED Notes (Signed)
Discharge instructions reviewed with patient. Patient questions answered and opportunity for education reviewed. Patient voices understanding of discharge instructions with no further questions. Patient ambulatory with steady gait to lobby.  
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
## 2022-12-09 NOTE — Discharge Instructions (Signed)
Please read and follow all provided instructions.  Your diagnoses today include:  1. Chronic abdominal pain     Tests performed today include: Blood cell counts and platelets Kidney and liver function tests Pancreas function test (called lipase) Urine test to look for infection Vital signs. See below for your results today.   Medications prescribed:  Phenergan (promethazine) - for nausea and vomiting  Take any prescribed medications only as directed.  Home care instructions:  Follow any educational materials contained in this packet.  Follow-up instructions: Please follow-up with your primary care provider in the next 2 days for further evaluation of your symptoms.    Return instructions:  SEEK IMMEDIATE MEDICAL ATTENTION IF: The pain does not go away or becomes severe  A temperature above 101F develops  Repeated vomiting occurs (multiple episodes)  The pain becomes localized to portions of the abdomen. The right side could possibly be appendicitis. In an adult, the left lower portion of the abdomen could be colitis or diverticulitis.  Blood is being passed in stools or vomit (bright red or black tarry stools)  You develop chest pain, difficulty breathing, dizziness or fainting, or become confused, poorly responsive, or inconsolable (young children) If you have any other emergent concerns regarding your health  Additional Information: Abdominal (belly) pain can be caused by many things. Your caregiver performed an examination and possibly ordered blood/urine tests and imaging (CT scan, x-rays, ultrasound). Many cases can be observed and treated at home after initial evaluation in the emergency department. Even though you are being discharged home, abdominal pain can be unpredictable. Therefore, you need a repeated exam if your pain does not resolve, returns, or worsens. Most patients with abdominal pain don't have to be admitted to the hospital or have surgery, but serious problems  like appendicitis and gallbladder attacks can start out as nonspecific pain. Many abdominal conditions cannot be diagnosed in one visit, so follow-up evaluations are very important.  Your vital signs today were: BP (!) 141/90   Pulse 92   Temp 98.7 F (37.1 C) (Oral)   Resp 15   Ht 5' (1.524 m)   Wt 49.9 kg   SpO2 95%   BMI 21.48 kg/m  If your blood pressure (bp) was elevated above 135/85 this visit, please have this repeated by your doctor within one month. --------------

## 2022-12-09 NOTE — ED Notes (Signed)
Fluid challenge successful. Pt able to tolerate 240 ml of fluid.

## 2022-12-13 ENCOUNTER — Other Ambulatory Visit (HOSPITAL_BASED_OUTPATIENT_CLINIC_OR_DEPARTMENT_OTHER): Payer: Self-pay

## 2022-12-13 ENCOUNTER — Other Ambulatory Visit: Payer: Self-pay

## 2023-01-11 ENCOUNTER — Emergency Department (HOSPITAL_BASED_OUTPATIENT_CLINIC_OR_DEPARTMENT_OTHER)
Admission: EM | Admit: 2023-01-11 | Discharge: 2023-01-11 | Disposition: A | Discharge status: Home / Self Care | Payer: 59 | Attending: Emergency Medicine | Admitting: Emergency Medicine

## 2023-01-11 ENCOUNTER — Other Ambulatory Visit: Payer: Self-pay

## 2023-01-11 ENCOUNTER — Emergency Department (HOSPITAL_BASED_OUTPATIENT_CLINIC_OR_DEPARTMENT_OTHER): Payer: 59

## 2023-01-11 ENCOUNTER — Encounter (HOSPITAL_BASED_OUTPATIENT_CLINIC_OR_DEPARTMENT_OTHER): Payer: Self-pay

## 2023-01-11 ENCOUNTER — Other Ambulatory Visit (HOSPITAL_BASED_OUTPATIENT_CLINIC_OR_DEPARTMENT_OTHER): Payer: Self-pay

## 2023-01-11 DIAGNOSIS — E782 Mixed hyperlipidemia: Secondary | ICD-10-CM | POA: Diagnosis not present

## 2023-01-11 DIAGNOSIS — R1013 Epigastric pain: Secondary | ICD-10-CM | POA: Diagnosis present

## 2023-01-11 DIAGNOSIS — I10 Essential (primary) hypertension: Secondary | ICD-10-CM | POA: Insufficient documentation

## 2023-01-11 DIAGNOSIS — G8929 Other chronic pain: Secondary | ICD-10-CM | POA: Insufficient documentation

## 2023-01-11 DIAGNOSIS — Z8541 Personal history of malignant neoplasm of cervix uteri: Secondary | ICD-10-CM | POA: Insufficient documentation

## 2023-01-11 DIAGNOSIS — Z79899 Other long term (current) drug therapy: Secondary | ICD-10-CM | POA: Diagnosis not present

## 2023-01-11 LAB — RAPID URINE DRUG SCREEN, HOSP PERFORMED
Amphetamines: NOT DETECTED
Barbiturates: NOT DETECTED
Benzodiazepines: NOT DETECTED
Cocaine: NOT DETECTED
Opiates: POSITIVE — AB
Tetrahydrocannabinol: NOT DETECTED

## 2023-01-11 LAB — COMPREHENSIVE METABOLIC PANEL
ALT: 34 U/L (ref 0–44)
AST: 32 U/L (ref 15–41)
Albumin: 4.5 g/dL (ref 3.5–5.0)
Alkaline Phosphatase: 102 U/L (ref 38–126)
Anion gap: 12 (ref 5–15)
BUN: 22 mg/dL (ref 8–23)
CO2: 22 mmol/L (ref 22–32)
Calcium: 9.8 mg/dL (ref 8.9–10.3)
Chloride: 103 mmol/L (ref 98–111)
Creatinine, Ser: 0.83 mg/dL (ref 0.44–1.00)
GFR, Estimated: >60 mL/min (ref >60)
Glucose, Bld: 101 mg/dL — ABNORMAL HIGH (ref 70–99)
Potassium: 4.1 mmol/L (ref 3.5–5.1)
Sodium: 137 mmol/L (ref 135–145)
Total Bilirubin: 0.5 mg/dL (ref 0.3–1.2)
Total Protein: 9.1 g/dL — ABNORMAL HIGH (ref 6.5–8.1)

## 2023-01-11 LAB — CBC WITH DIFFERENTIAL/PLATELET
Abs Immature Granulocytes: 0.07 10*3/uL (ref 0.00–0.07)
Basophils Absolute: 0 10*3/uL (ref 0.0–0.1)
Basophils Relative: 0 %
Eosinophils Absolute: 0.1 10*3/uL (ref 0.0–0.5)
Eosinophils Relative: 1 %
HCT: 42.4 % (ref 36.0–46.0)
Hemoglobin: 14.4 g/dL (ref 12.0–15.0)
Immature Granulocytes: 1 %
Lymphocytes Relative: 22 %
Lymphs Abs: 2.1 10*3/uL (ref 0.7–4.0)
MCH: 30.4 pg (ref 26.0–34.0)
MCHC: 34 g/dL (ref 30.0–36.0)
MCV: 89.6 fL (ref 80.0–100.0)
Monocytes Absolute: 0.5 10*3/uL (ref 0.1–1.0)
Monocytes Relative: 5 %
Neutro Abs: 6.8 10*3/uL (ref 1.7–7.7)
Neutrophils Relative %: 71 %
Platelets: 293 10*3/uL (ref 150–400)
RBC: 4.73 MIL/uL (ref 3.87–5.11)
RDW: 12.4 % (ref 11.5–15.5)
WBC: 9.6 10*3/uL (ref 4.0–10.5)
nRBC: 0 % (ref 0.0–0.2)

## 2023-01-11 LAB — URINALYSIS, ROUTINE W REFLEX MICROSCOPIC
Bilirubin Urine: NEGATIVE
Glucose, UA: NEGATIVE mg/dL
Ketones, ur: NEGATIVE mg/dL
Leukocytes,Ua: NEGATIVE
Nitrite: NEGATIVE
Protein, ur: NEGATIVE mg/dL
Specific Gravity, Urine: 1.01 (ref 1.005–1.030)
pH: 6 (ref 5.0–8.0)

## 2023-01-11 LAB — URINALYSIS, MICROSCOPIC (REFLEX)
Bacteria, UA: NONE SEEN
Squamous Epithelial / HPF: NONE SEEN /HPF (ref 0–5)
WBC, UA: NONE SEEN WBC/hpf (ref 0–5)

## 2023-01-11 LAB — LIPID PANEL
Cholesterol: 275 mg/dL — ABNORMAL HIGH (ref 0–200)
HDL: 74 mg/dL (ref >40)
LDL Cholesterol: 157 mg/dL — ABNORMAL HIGH (ref 0–99)
Total CHOL/HDL Ratio: 3.7 RATIO
Triglycerides: 221 mg/dL — ABNORMAL HIGH (ref <150)
VLDL: 44 mg/dL — ABNORMAL HIGH (ref 0–40)

## 2023-01-11 LAB — ETHANOL: Alcohol, Ethyl (B): <10 mg/dL (ref <10)

## 2023-01-11 LAB — LIPASE, BLOOD: Lipase: 31 U/L (ref 11–51)

## 2023-01-11 MED ORDER — ONDANSETRON HCL 4 MG PO TABS
4.0000 mg | ORAL_TABLET | ORAL | 0 refills | Status: AC | PRN
  Filled 2023-01-11: qty 12, 2d supply, fill #0

## 2023-01-11 MED ORDER — MORPHINE SULFATE (PF) 4 MG/ML IV SOLN
4.0000 mg | Freq: Once | INTRAVENOUS | Status: AC
  Administered 2023-01-11: 4 mg via INTRAVENOUS
  Filled 2023-01-11: qty 1

## 2023-01-11 MED ORDER — SODIUM CHLORIDE 0.9 % IV BOLUS
1000.0000 mL | Freq: Once | INTRAVENOUS | Status: AC
  Administered 2023-01-11: 1000 mL via INTRAVENOUS

## 2023-01-11 MED ORDER — HYDROMORPHONE HCL 1 MG/ML IJ SOLN
0.5000 mg | Freq: Once | INTRAMUSCULAR | Status: AC
  Administered 2023-01-11: 0.5 mg via INTRAVENOUS
  Filled 2023-01-11: qty 1

## 2023-01-11 MED ORDER — SUCRALFATE 1 G PO TABS
1.0000 g | ORAL_TABLET | Freq: Three times a day (TID) | ORAL | 0 refills | Status: AC
  Filled 2023-01-11: qty 21, 7d supply, fill #0

## 2023-01-11 MED ORDER — ONDANSETRON HCL 4 MG/2ML IJ SOLN
4.0000 mg | Freq: Once | INTRAMUSCULAR | Status: AC
  Administered 2023-01-11: 4 mg via INTRAVENOUS
  Filled 2023-01-11: qty 2

## 2023-01-11 MED ORDER — FAMOTIDINE IN NACL 20-0.9 MG/50ML-% IV SOLN
20.0000 mg | Freq: Once | INTRAVENOUS | Status: AC
  Administered 2023-01-11: 20 mg via INTRAVENOUS
  Filled 2023-01-11: qty 50

## 2023-01-11 NOTE — Discharge Instructions (Addendum)
It was a pleasure caring for you today in the emergency department.  Please return to the emergency department for any worsening or worrisome symptoms.  Please follow-up with your gastroenterologist in [facility] for further management of your chronic pancreatitis

## 2023-01-11 NOTE — ED Triage Notes (Signed)
C/o mid/right upper abdominal pain x 4 days. Hx of pancreatitis. Having N/V/D, states unable to keep anything down.

## 2023-01-11 NOTE — ED Notes (Signed)
D/c paperwork reviewed with pt, including follow up care.  No questions or concerns voiced at time of d/c. . Pt verbalized understanding, Ambulatory with family to ED exit, NAD.   

## 2023-01-11 NOTE — ED Notes (Signed)
Pt denied PO challenge, EDP notified

## 2023-01-11 NOTE — ED Provider Notes (Signed)
[place] [facility]PARTMENT AT [facility] Provider Note  CSN: [id number] Arrival date & time: 01/11/23 6213  Chief Complaint(s) Abdominal Pain  HPI Rachel Pitts is a 62 y.o. female with past medical history as below, significant for chronic opiate use, recurrent pancreatitis, chronic pain, hysterectomy, cholecystectomy who presents to the ED with complaint of epigastric abd pain. Onset 3-4 days ago, epigastric cramping/burning/sharp; similar to prior pancreatitis. Nausea/vom/diarrhea, NBNB emesis, no BRBPR or melena. No fevers, no suspicious po or recent travel or sick contacts. No recent etoh, nsaid, or excessive apap use, no thc use. Took home oxy last night with mild relief of pain. Follows with GI at [facility], scheduled for MRCP next week   Past Medical History Past Medical History:  Diagnosis Date   Cancer (HCC)    cervical   GERD (gastroesophageal reflux disease)    Hypertension    Irregular heart rate    Pancreatitis    Patient Active Problem List   Diagnosis Date Noted   Hypokalemia 11/15/2022   Hypomagnesemia 11/15/2022   AKI (acute kidney injury) (HCC) 11/15/2022   Chronic pain 11/15/2022   Hypotension 11/15/2022   Insomnia 11/15/2022   Acute pancreatitis 11/14/2022   Home Medication(s) Prior to Admission medications   Medication Sig Start Date End Date Taking? Authorizing Provider  Eszopiclone 3 MG TABS Take 3 mg by mouth at bedtime. 10/28/22  Yes [provider]  omeprazole (PRILOSEC) 20 MG capsule Take 20 mg by mouth daily. 08/08/17  Yes [provider]  ondansetron (ZOFRAN) 4 MG tablet Take 1 tablet (4 mg total) by mouth every 4 (four) hours as needed for nausea or vomiting. 01/11/23  Yes Tanda Rockers A, DO  oxyCODONE (ROXICODONE) 15 MG immediate release tablet Take 1 tablet (15 mg total) by mouth every 6 (six) hours as needed for pain. 11/18/22  Yes [place], Eric J, DO  promethazine (PHENERGAN) 25 MG tablet Take 1 tablet (25 mg total) by  mouth every 6 (six) hours as needed for nausea or vomiting. 12/09/22  Yes Renne Crigler, PA-C  sucralfate (CARAFATE) 1 g tablet Take 1 tablet (1 g total) by mouth with breakfast, with lunch, and with evening meal for 7 days. 01/11/23 01/18/23 Yes Tanda Rockers A, DO  tiZANidine (ZANAFLEX) 4 MG tablet Take 4 mg by mouth 3 (three) times daily. 11/23/22  Yes [provider]  amLODipine (NORVASC) 10 MG tablet Take 1 tablet (10 mg total) by mouth daily. Patient not taking: Reported on 01/11/2023 11/19/22 02/17/23  [place], Eric J, DO  carvedilol (COREG) 12.5 MG tablet Take 1 tablet (12.5 mg total) by mouth 2 (two) times daily with a meal. Patient not taking: Reported on 01/11/2023 11/18/22 02/16/23  [place], Eric J, DO                                                                                                                                    Past  Surgical History Past Surgical History:  Procedure Laterality Date   ABDOMINAL HYSTERECTOMY     ABDOMINAL SURGERY     CHOLECYSTECTOMY     implanted heart monitor     Family History History reviewed. No pertinent family history.  Social History Social History   Tobacco Use   Smoking status: Never   Smokeless tobacco: Never  Vaping Use   Vaping Use: Never used  Substance Use Topics   Alcohol use: No   Drug use: No   Allergies Aspirin, Iodine, Ivp dye [iodinated contrast media], Ketorolac, Morphine and codeine, and Tramadol  Review of Systems Review of Systems  Constitutional:  Negative for activity change and fever.  HENT:  Negative for facial swelling and trouble swallowing.   Eyes:  Negative for discharge and redness.  Respiratory:  Negative for cough and shortness of breath.   Cardiovascular:  Negative for chest pain and palpitations.  Gastrointestinal:  Positive for abdominal pain, diarrhea, nausea and vomiting.  Genitourinary:  Negative for dysuria and flank pain.  Musculoskeletal:  Negative for back pain and gait problem.   Skin:  Negative for pallor and rash.  Neurological:  Negative for syncope and headaches.    Physical Exam Vital Signs  I have reviewed the triage vital signs BP (!) 158/84   Pulse 72   Temp 97.7 F (36.5 C) (Oral)   Resp 10   Ht 5' (1.524 m)   Wt 49.9 kg   SpO2 100%   BMI 21.48 kg/m  Physical Exam Vitals and nursing note reviewed. Exam conducted with a chaperone present.  Constitutional:      General: She is not in acute distress.    Appearance: Normal appearance. She is well-developed.  HENT:     Head: Normocephalic and atraumatic.     Right Ear: External ear normal.     Left Ear: External ear normal.     Nose: Nose normal.     Mouth/Throat:     Mouth: Mucous membranes are moist.  Eyes:     General: No scleral icterus.       Right eye: No discharge.        Left eye: No discharge.  Cardiovascular:     Rate and Rhythm: Normal rate and regular rhythm.     Pulses: Normal pulses.     Heart sounds: Normal heart sounds.  Pulmonary:     Effort: Pulmonary effort is normal. No respiratory distress.     Breath sounds: Normal breath sounds.  Abdominal:     General: Abdomen is flat.     Palpations: Abdomen is soft.     Tenderness: There is abdominal tenderness in the epigastric area. There is no guarding or rebound.     Comments: Not peritoneal   Musculoskeletal:     Right lower leg: No edema.     Left lower leg: No edema.  Skin:    General: Skin is warm and dry.     Capillary Refill: Capillary refill takes less than 2 seconds.  Neurological:     Mental Status: She is alert.  Psychiatric:        Mood and Affect: Mood normal.        Behavior: Behavior normal.     ED Results and Treatments Labs (all labs ordered are listed, but only abnormal results are displayed) Labs Reviewed  COMPREHENSIVE METABOLIC PANEL - Abnormal; Notable for the following components:      Result Value   Glucose, Bld 101 (*)  Total Protein 9.1 (*)    All other components within normal  limits  URINALYSIS, ROUTINE W REFLEX MICROSCOPIC - Abnormal; Notable for the following components:   Color, Urine STRAW (*)    Hgb urine dipstick TRACE (*)    All other components within normal limits  RAPID URINE DRUG SCREEN, HOSP PERFORMED - Abnormal; Notable for the following components:   Opiates POSITIVE (*)    All other components within normal limits  LIPID PANEL - Abnormal; Notable for the following components:   Cholesterol 275 (*)    Triglycerides 221 (*)    VLDL 44 (*)    LDL Cholesterol 157 (*)    All other components within normal limits  CBC WITH DIFFERENTIAL/PLATELET  LIPASE, BLOOD  ETHANOL  URINALYSIS, MICROSCOPIC (REFLEX)                                                                                                                          Radiology CT ABDOMEN PELVIS WO CONTRAST  Result Date: 01/11/2023 CLINICAL DATA:  Acute epigastric abdominal pain. EXAM: CT ABDOMEN AND PELVIS WITHOUT CONTRAST TECHNIQUE: Multidetector CT imaging of the abdomen and pelvis was performed following the standard protocol without IV contrast. RADIATION DOSE REDUCTION: This exam was performed according to the departmental dose-optimization program which includes automated exposure control, adjustment of the mA and/or kV according to patient size and/or use of iterative reconstruction technique. COMPARISON:  Dec 07, 2022. FINDINGS: Lower chest: No acute abnormality. Hepatobiliary: No focal liver abnormality is seen. Status post cholecystectomy. No biliary dilatation. Pancreas: Unremarkable. No pancreatic ductal dilatation or surrounding inflammatory changes. Spleen: Normal in size without focal abnormality. Adrenals/Urinary Tract: Adrenal glands are unremarkable. Kidneys are normal, without renal calculi, focal lesion, or hydronephrosis. Bladder is unremarkable. Stomach/Bowel: Stomach is within normal limits. Appendix appears normal. No evidence of bowel wall thickening, distention, or inflammatory  changes. Vascular/Lymphatic: Aortic atherosclerosis. No enlarged abdominal or pelvic lymph nodes. Reproductive: Status post hysterectomy. No adnexal masses. Other: No abdominal wall hernia or abnormality. No abdominopelvic ascites. Musculoskeletal: No acute or significant osseous findings. IMPRESSION: No acute abnormality seen in the abdomen or pelvis. Aortic Atherosclerosis (ICD10-I70.0). Electronically Signed   By: Lupita Raider M.D.   On: 01/11/2023 12:54    Pertinent labs & imaging results that were available during my care of the patient were reviewed by me and considered in my medical decision making (see MDM for details).  Medications Ordered in ED Medications  sodium chloride 0.9 % bolus 1,000 mL (0 mLs Intravenous Stopped 01/11/23 1129)  ondansetron (ZOFRAN) injection 4 mg (4 mg Intravenous Given 01/11/23 1000)  morphine (PF) 4 MG/ML injection 4 mg (4 mg Intravenous Given 01/11/23 1003)  famotidine (PEPCID) IVPB 20 mg premix (0 mg Intravenous Stopped 01/11/23 1030)  HYDROmorphone (DILAUDID) injection 0.5 mg (0.5 mg Intravenous Given 01/11/23 1028)  Procedures Procedures  (including critical care time)  Medical Decision Making / ED Course    Medical Decision Making:    Mairim Piacentini is a 62 y.o. female  with past medical history as below, significant for chronic opiate use, recurrent pancreatitis, chronic pain, hysterectomy, cholecystectomy who presents to the ED with complaint of epigastric abd pain. . The complaint involves an extensive differential diagnosis and also carries with it a high risk of complications and morbidity.  Serious etiology was considered. Ddx includes but is not limited to: Differential diagnosis includes but is not exclusive to acute cholecystitis, intrathoracic causes for epigastric abdominal pain, gastritis, duodenitis, pancreatitis,  small bowel or large bowel obstruction, abdominal aortic aneurysm, hernia, gastritis, etc.   Complete initial physical exam performed, notably the patient  was HDS, abd not peritoneal, speaking fluently in full sentences.    Reviewed and confirmed nursing documentation for past medical history, family history, social history.  Vital signs reviewed.    Clinical Course as of 01/11/23 1352  Wed Jan 11, 2023  1045 Pt requesting dilaudid and phenergan by name  [SG]    Clinical Course User Index [SG] Sloan Leiter, DO     Labs reviewed.  She has elevated triglycerides and cholesterol. Urinalysis without acute infection.  CBC stable.  UDS with opiates, she takes opiates at home as prescribed. CMP stable.  Lipase not elevated CT imaging of her abdomen is stable, no acute abnormalities She is feeling better on recheck and is requesting discharge.  Favor her symptoms today likely secondary to her chronic abdominal pain.  Advised her to follow-up with her gastroenterologist and primary care doctor.  The patient improved significantly and was discharged in stable condition. Detailed discussions were had with the patient regarding current findings, and need for close f/u with PCP or on call doctor. The patient has been instructed to return immediately if the symptoms worsen in any way for re-evaluation. Patient verbalized understanding and is in agreement with current care plan. All questions answered prior to discharge.     Additional history obtained: -Additional history obtained from na -External records from outside source obtained and reviewed including: Chart review including previous notes, labs, imaging, consultation notes including prior ed visits, home meds, pdmp, osh records She was admitted 4/22 with acute on chronic pancreatitis, discharged in stable condition Seen in ED 5/15 and 5/17 with similar symptoms.  Reviewed GI note 6/4 [facility] NP > pending MRCP; concern for  "visceral hypersensitivity vs narcotic hyperalgesia"      Lab Tests: -I ordered, reviewed, and interpreted labs.   The pertinent results include:   Labs Reviewed  COMPREHENSIVE METABOLIC PANEL - Abnormal; Notable for the following components:      Result Value   Glucose, Bld 101 (*)    Total Protein 9.1 (*)    All other components within normal limits  URINALYSIS, ROUTINE W REFLEX MICROSCOPIC - Abnormal; Notable for the following components:   Color, Urine STRAW (*)    Hgb urine dipstick TRACE (*)    All other components within normal limits  RAPID URINE DRUG SCREEN, HOSP PERFORMED - Abnormal; Notable for the following components:   Opiates POSITIVE (*)    All other components within normal limits  LIPID PANEL - Abnormal; Notable for the following components:   Cholesterol 275 (*)    Triglycerides 221 (*)    VLDL 44 (*)    LDL Cholesterol 157 (*)    All other components within normal limits  CBC WITH DIFFERENTIAL/PLATELET  LIPASE, BLOOD  ETHANOL  URINALYSIS, MICROSCOPIC (REFLEX)    Notable for as above  EKG   EKG Interpretation  Date/Time:    Ventricular Rate:    PR Interval:    QRS Duration:   QT Interval:    QTC Calculation:   R Axis:     Text Interpretation:           Imaging Studies ordered: I ordered imaging studies including ctap I independently visualized the following imaging with scope of interpretation limited to determining acute life threatening conditions related to emergency care; findings noted above, significant for stable imaging I independently visualized and interpreted imaging. I agree with the radiologist interpretation   Medicines ordered and prescription drug management: Meds ordered this encounter  Medications   sodium chloride 0.9 % bolus 1,000 mL   ondansetron (ZOFRAN) injection 4 mg   morphine (PF) 4 MG/ML injection 4 mg   famotidine (PEPCID) IVPB 20 mg premix   HYDROmorphone (DILAUDID) injection 0.5 mg   ondansetron  (ZOFRAN) 4 MG tablet    Sig: Take 1 tablet (4 mg total) by mouth every 4 (four) hours as needed for nausea or vomiting.    Dispense:  12 tablet    Refill:  0   sucralfate (CARAFATE) 1 g tablet    Sig: Take 1 tablet (1 g total) by mouth with breakfast, with lunch, and with evening meal for 7 days.    Dispense:  21 tablet    Refill:  0    -I have reviewed the patients home medicines and have made adjustments as needed   Consultations Obtained: na   Cardiac Monitoring: The patient was maintained on a cardiac monitor.  I personally viewed and interpreted the cardiac monitored which showed an underlying rhythm of: NSR.  Continuous pulse oximetry 99 to 100% on room air  Social Determinants of Health:  Diagnosis or treatment significantly limited by social determinants of health: opiate dependence   Reevaluation: After the interventions noted above, I reevaluated the patient and found that they have improved  Co morbidities that complicate the patient evaluation  Past Medical History:  Diagnosis Date   Cancer (HCC)    cervical   GERD (gastroesophageal reflux disease)    Hypertension    Irregular heart rate    Pancreatitis       Dispostion: Disposition decision including need for hospitalization was considered, and patient discharged from emergency department.    Final Clinical Impression(s) / ED Diagnoses Final diagnoses:  Chronic abdominal pain  Elevated cholesterol with elevated triglycerides     This chart was dictated using voice recognition software.  Despite best efforts to proofread,  errors can occur which can change the documentation meaning.    Sloan Leiter, DO 01/11/23 1352

## 2023-01-11 NOTE — ED Notes (Signed)
Assisted patient to bathroom without incident. She's obtaining a specimen.

## 2023-01-20 ENCOUNTER — Other Ambulatory Visit (HOSPITAL_BASED_OUTPATIENT_CLINIC_OR_DEPARTMENT_OTHER): Payer: Self-pay

## 2023-04-06 ENCOUNTER — Other Ambulatory Visit: Payer: Self-pay

## 2023-04-06 ENCOUNTER — Inpatient Hospital Stay (HOSPITAL_BASED_OUTPATIENT_CLINIC_OR_DEPARTMENT_OTHER)
Admission: EM | Admit: 2023-04-06 | Discharge: 2023-04-09 | DRG: 392 | Disposition: A | Discharge status: Home / Self Care | Payer: 59 | Attending: Internal Medicine | Admitting: Internal Medicine

## 2023-04-06 ENCOUNTER — Encounter (HOSPITAL_BASED_OUTPATIENT_CLINIC_OR_DEPARTMENT_OTHER): Payer: Self-pay | Admitting: Emergency Medicine

## 2023-04-06 ENCOUNTER — Emergency Department (HOSPITAL_BASED_OUTPATIENT_CLINIC_OR_DEPARTMENT_OTHER): Payer: 59

## 2023-04-06 DIAGNOSIS — R1084 Generalized abdominal pain: Principal | ICD-10-CM | POA: Diagnosis present

## 2023-04-06 DIAGNOSIS — G894 Chronic pain syndrome: Secondary | ICD-10-CM | POA: Diagnosis present

## 2023-04-06 DIAGNOSIS — K859 Acute pancreatitis without necrosis or infection, unspecified: Secondary | ICD-10-CM | POA: Diagnosis not present

## 2023-04-06 DIAGNOSIS — Z91041 Radiographic dye allergy status: Secondary | ICD-10-CM

## 2023-04-06 DIAGNOSIS — Z765 Malingerer [conscious simulation]: Secondary | ICD-10-CM

## 2023-04-06 DIAGNOSIS — K219 Gastro-esophageal reflux disease without esophagitis: Secondary | ICD-10-CM | POA: Diagnosis not present

## 2023-04-06 DIAGNOSIS — K861 Other chronic pancreatitis: Secondary | ICD-10-CM | POA: Diagnosis present

## 2023-04-06 DIAGNOSIS — I10 Essential (primary) hypertension: Secondary | ICD-10-CM | POA: Diagnosis present

## 2023-04-06 DIAGNOSIS — E876 Hypokalemia: Secondary | ICD-10-CM | POA: Diagnosis not present

## 2023-04-06 DIAGNOSIS — Z885 Allergy status to narcotic agent status: Secondary | ICD-10-CM

## 2023-04-06 DIAGNOSIS — R109 Unspecified abdominal pain: Secondary | ICD-10-CM | POA: Diagnosis present

## 2023-04-06 DIAGNOSIS — Z79899 Other long term (current) drug therapy: Secondary | ICD-10-CM

## 2023-04-06 DIAGNOSIS — I959 Hypotension, unspecified: Secondary | ICD-10-CM | POA: Diagnosis present

## 2023-04-06 DIAGNOSIS — Z888 Allergy status to other drugs, medicaments and biological substances status: Secondary | ICD-10-CM

## 2023-04-06 DIAGNOSIS — G8929 Other chronic pain: Principal | ICD-10-CM | POA: Diagnosis present

## 2023-04-06 DIAGNOSIS — Z9049 Acquired absence of other specified parts of digestive tract: Secondary | ICD-10-CM | POA: Diagnosis not present

## 2023-04-06 DIAGNOSIS — R064 Hyperventilation: Secondary | ICD-10-CM | POA: Diagnosis not present

## 2023-04-06 LAB — CBC WITH DIFFERENTIAL/PLATELET
Abs Immature Granulocytes: 0.05 10*3/uL (ref 0.00–0.07)
Basophils Absolute: 0 10*3/uL (ref 0.0–0.1)
Basophils Relative: 0 %
Eosinophils Absolute: 0 10*3/uL (ref 0.0–0.5)
Eosinophils Relative: 0 %
HCT: 44.2 % (ref 36.0–46.0)
Hemoglobin: 15.5 g/dL — ABNORMAL HIGH (ref 12.0–15.0)
Immature Granulocytes: 0 %
Lymphocytes Relative: 20 %
Lymphs Abs: 2.2 10*3/uL (ref 0.7–4.0)
MCH: 30.3 pg (ref 26.0–34.0)
MCHC: 35.1 g/dL (ref 30.0–36.0)
MCV: 86.3 fL (ref 80.0–100.0)
Monocytes Absolute: 0.7 10*3/uL (ref 0.1–1.0)
Monocytes Relative: 6 %
Neutro Abs: 8.2 10*3/uL — ABNORMAL HIGH (ref 1.7–7.7)
Neutrophils Relative %: 74 %
Platelets: 274 10*3/uL (ref 150–400)
RBC: 5.12 MIL/uL — ABNORMAL HIGH (ref 3.87–5.11)
RDW: 11.9 % (ref 11.5–15.5)
WBC: 11.3 10*3/uL — ABNORMAL HIGH (ref 4.0–10.5)
nRBC: 0 % (ref 0.0–0.2)

## 2023-04-06 LAB — COMPREHENSIVE METABOLIC PANEL
ALT: 16 U/L (ref 0–44)
AST: 20 U/L (ref 15–41)
Albumin: 4.8 g/dL (ref 3.5–5.0)
Alkaline Phosphatase: 97 U/L (ref 38–126)
Anion gap: 17 — ABNORMAL HIGH (ref 5–15)
BUN: 15 mg/dL (ref 8–23)
CO2: 17 mmol/L — ABNORMAL LOW (ref 22–32)
Calcium: 9.9 mg/dL (ref 8.9–10.3)
Chloride: 107 mmol/L (ref 98–111)
Creatinine, Ser: 0.83 mg/dL (ref 0.44–1.00)
GFR, Estimated: >60 mL/min (ref >60)
Glucose, Bld: 102 mg/dL — ABNORMAL HIGH (ref 70–99)
Potassium: 3.5 mmol/L (ref 3.5–5.1)
Sodium: 141 mmol/L (ref 135–145)
Total Bilirubin: 0.7 mg/dL (ref 0.3–1.2)
Total Protein: 8.9 g/dL — ABNORMAL HIGH (ref 6.5–8.1)

## 2023-04-06 LAB — LIPASE, BLOOD: Lipase: 31 U/L (ref 11–51)

## 2023-04-06 MED ORDER — SODIUM CHLORIDE 0.9 % IV SOLN
12.5000 mg | Freq: Four times a day (QID) | INTRAVENOUS | Status: DC | PRN
  Administered 2023-04-06: 12.5 mg via INTRAVENOUS
  Filled 2023-04-06: qty 0.5

## 2023-04-06 MED ORDER — PANTOPRAZOLE SODIUM 40 MG PO TBEC
40.0000 mg | DELAYED_RELEASE_TABLET | Freq: Every day | ORAL | Status: DC
  Administered 2023-04-07 – 2023-04-09 (×3): 40 mg via ORAL
  Filled 2023-04-06 (×3): qty 1

## 2023-04-06 MED ORDER — HYDROMORPHONE HCL 1 MG/ML IJ SOLN
1.0000 mg | Freq: Once | INTRAMUSCULAR | Status: AC
  Administered 2023-04-06: 1 mg via INTRAVENOUS
  Filled 2023-04-06: qty 1

## 2023-04-06 MED ORDER — SODIUM CHLORIDE 0.9 % IV BOLUS
1000.0000 mL | Freq: Once | INTRAVENOUS | Status: AC
  Administered 2023-04-06: 1000 mL via INTRAVENOUS

## 2023-04-06 MED ORDER — LACTATED RINGERS IV SOLN: INTRAVENOUS | Status: AC

## 2023-04-06 MED ORDER — HYDROMORPHONE HCL 1 MG/ML IJ SOLN: 1.0000 mg | Freq: Once | INTRAMUSCULAR | Status: DC

## 2023-04-06 MED ORDER — LABETALOL HCL 5 MG/ML IV SOLN
10.0000 mg | INTRAVENOUS | Status: DC | PRN
  Administered 2023-04-06: 10 mg via INTRAVENOUS
  Filled 2023-04-06: qty 4

## 2023-04-06 MED ORDER — SENNOSIDES-DOCUSATE SODIUM 8.6-50 MG PO TABS
1.0000 | ORAL_TABLET | Freq: Every evening | ORAL | Status: DC | PRN
  Filled 2023-04-06: qty 1

## 2023-04-06 MED ORDER — PROMETHAZINE HCL 25 MG/ML IJ SOLN
INTRAMUSCULAR | Status: AC
  Filled 2023-04-06: qty 1

## 2023-04-06 MED ORDER — ENOXAPARIN SODIUM 40 MG/0.4ML IJ SOSY
40.0000 mg | PREFILLED_SYRINGE | INTRAMUSCULAR | Status: DC
  Administered 2023-04-06 – 2023-04-08 (×3): 40 mg via SUBCUTANEOUS
  Filled 2023-04-06 (×3): qty 0.4

## 2023-04-06 MED ORDER — ONDANSETRON HCL 4 MG/2ML IJ SOLN
4.0000 mg | Freq: Once | INTRAMUSCULAR | Status: AC
  Administered 2023-04-06: 4 mg via INTRAVENOUS
  Filled 2023-04-06: qty 2

## 2023-04-06 MED ORDER — ACETAMINOPHEN 650 MG RE SUPP: 650.0000 mg | Freq: Four times a day (QID) | RECTAL | Status: DC | PRN

## 2023-04-06 MED ORDER — OXYCODONE HCL 5 MG PO TABS
15.0000 mg | ORAL_TABLET | Freq: Four times a day (QID) | ORAL | Status: DC | PRN
  Administered 2023-04-07: 15 mg via ORAL
  Filled 2023-04-06: qty 3

## 2023-04-06 MED ORDER — ACETAMINOPHEN 325 MG PO TABS
650.0000 mg | ORAL_TABLET | Freq: Four times a day (QID) | ORAL | Status: DC | PRN
  Administered 2023-04-07: 650 mg via ORAL
  Filled 2023-04-06: qty 2

## 2023-04-06 MED ORDER — HYDROMORPHONE HCL 1 MG/ML IJ SOLN
1.0000 mg | INTRAMUSCULAR | Status: DC | PRN
  Administered 2023-04-06 – 2023-04-07 (×6): 1 mg via INTRAVENOUS
  Filled 2023-04-06 (×6): qty 1

## 2023-04-06 MED ORDER — ZOLPIDEM TARTRATE 5 MG PO TABS
5.0000 mg | ORAL_TABLET | Freq: Every day | ORAL | Status: DC
  Administered 2023-04-06 – 2023-04-08 (×2): 5 mg via ORAL
  Filled 2023-04-06 (×3): qty 1

## 2023-04-06 MED ORDER — ONDANSETRON HCL 4 MG/2ML IJ SOLN
4.0000 mg | Freq: Four times a day (QID) | INTRAMUSCULAR | Status: DC | PRN
  Administered 2023-04-07: 4 mg via INTRAVENOUS
  Filled 2023-04-06: qty 2

## 2023-04-06 NOTE — Assessment & Plan Note (Addendum)
Does not appear to be taking antihypertensives currently as an outpatient. IV labetalol 10 mg as needed for SBP >170 Start coreg and norvasc.

## 2023-04-06 NOTE — Assessment & Plan Note (Addendum)
Admitted for Persistent uncontrolled epigastric pain likely related to acute on chronic pancreatitis.  CT A/P was unrevealing.  Has not been able to maintain adequate oral intake or keep her home medicines down.  I have extensively reviewed her CareEverywhere notes from [place] in [place], [facility] in [place], [address]. Pt has never has CT or MRI abd that shows acute pancreatitis. When pt was in [place], there was concern by outpatient pain clinic about pt's validity for continuing opiate medications. That outpatient [place] pain clinic declined to continue to prescribe pt's opiates. Pt has established herself with [facility] for her oxycodone but interestingly has not sought out GI assistance from [facility] medical center.  Pt states she has had celiac blocks in the past that were ineffective.  There is mention in CareEverywhere about pt have hyperalgesia due to opiates. Pt unwilling to stop her oxycodone.  Pt mentions that she had her gallbladder removed in [place] in 1990s. And that the surgeon "messed" up her surgery.  Unfortunately, those records are not available to review.  Will check fecal pancreatic elastase to workup for pancreatic insufficiency.  She was seen by GI specialist at [facility] in 05-2015 that doubted pt ever having chronic pancreatitis. And if she did have pancreatitis, it was likely due to the repeated ERCP she has had in the past.  I am deleting her diagnosis of chronic pancreatitis from her Problem List and replacing it with chronic abd pain.  I believe she has drug seeking behavior for IV opiates.  IV dilaudid was initially prescribed. This dose was reduced and subsequently stopped and pt has no indication for IV opiates.  Her oxycodone was temporarily increased during her hospital stay to 20 mg q6h. Pt has had oxycodone 15 mg #120 filled on 03-26-2023 for 30 day supply.  Pt sees [facility] for her [facility]codone.  Pt tolerated soft diet. Plan for  Discharge today.  Please note that pt's lipase was normal her entire stay in the hospital. And 6 CT abd/pelvis scans in 2024(3-20, 4-22, 5-15-, 6-19, 9-12) all show normal pancreatic contours. No inflammation. No evidence of pancreatic calcifications or pancreatic atrophy.  Each of these CT scan were performed in the ER during her complaints of abd pain/pancreatitis flare.  ////////////////////////////////////////////////////////////////////////////////////////////////////////////////////////////////// Condensed version: Is that GI specialist at [facility] did not think pt has chronic pancreatitis but rather chronic abd pain and hyperanalgesia from too much opiates.  Pt has only had one documented pancreatitis AFTER a ERCP procedure. No imaging has ever shown to her acute pancreatic inflammation during one of her "pancreatic flares".  Nor does she show any signs of chronic pancreatitis such as pancreatic calcifications or pancreatic atrophy.  ////////////////////////////////////////////////////////////////////////////////////////////////////////////////////////////////// From GI consult note in 2016 at [facility]: Read Dr. Audelia Hives entire impression here:(from office visit 06-02-2015) Rachel Pitts is a very pleasant 62 year old woman who carries a diagnosis of chronic pancreatitis and has undergone extensive endotherapy for this condition and for pancreas divisum. She presents to clinic with what she describes as progressively worsening recurrence of her pancreatic type abdominal pain.   I had an extensive discussion with Rachel Pitts in clinic regarding her abdominal pain syndrome. We discussed that ERCP has clearly not been a silver bullet in terms of durable response and is unlikely to represent something that will significantly impact her natural history in the [place] run. We did discuss, however, that her improvement for approximately six years after Dr. Orlene Erm' ERCP with recent recurrence of symptoms does  suggest the possibility  of minor papilla restenosis or a pancreatic duct stricture and, on this basis, an ERCP could be considered, provided she understands the limitations and risks of this procedure. We discussed that, in addition to ERCP, I believe it is important to perform an endoscopic ultrasound to determine the presence and severity of chronic pancreatitis, which may have implications in future management, including the possibility of a total pancreatectomy with islet autotransplantation.   I performed an endoscopic ultrasound and ERCP on Rachel Pitts this week. During EUS, we identified some hyperechoic foci and non-honeycombed lobularity in the pancreas. Her pancreatic duct was mildly irregular. Overall, these changes were not very prominent. Although they were indeterminate for a diagnosis of chronic pancreatitis by [place] criteria, they certainly did not represent a severe form of chronic pancreatitis that I expected on the basis of her symptom complex. 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 Her upstream pancreatic duct was slightly dilated with some irregularity and prominent side branches. We placed a 7 Fr stent across the minor papilla into her bile duct, over which I performed a needle-knife minor papillotomy extension. This procedure was complicated by severe abdominal pain, and radiographically mild post-ERCP pancreatitis.   Overall, based on EUS and ERCP, I do believe that Rachel Pitts probably does have some element of chronic pancreatitis, although I am not certain that this is the primary driving process rather than a consequence of repeated ERCPs with prolonged pancreatic ductal stenting.   In either case, it seems unlikely that her debilitating and narcotic-requiring abdominal pain is solely the result of pancreatic pathology. 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 Of note is that within 2 hours of my initial clinic visit Rachel Pitts called our office twice for a prescription.   Certainly, you may want to consider these entities in your [place]-term strategy for treating her abdominal pain moving forward if minor papilla stenting does not result in substantial improvement. I had discussed with Rachel Pitts total pancreatectomy and islet autotransplantation, and please fee at liberty to refer Rachel Pitts to Dr. Katina Degree at [facility] for consideration of this intervention, although based on the appearance of her pancreas and the characteristics of her pain, I wonder whether she will continue to have significant abdominal pain after such intervention.  Thank you very much for allowing me to participate in the care of this very pleasant woman. Feel free to contact me via Meduline with any additional questions or concerns.  Sincerely,  B. Ponciano Ort, MD Associate Professor of Medicine [address], [facility] [address] [place], [place] [address]

## 2023-04-06 NOTE — H&P (Signed)
History and Physical    Rachel Pitts ZOX:[id number] DOB: 07-Aug-1960 DOA: 04/06/2023  PCP: [facility]  Patient coming from: Home  I have personally briefly reviewed patient's old medical records in [facility]  Chief Complaint: Abdominal pain  HPI: Rachel Pitts is a 62 y.o. female with medical history significant for chronic pancreatitis with chronic pain syndrome, HTN, GERD who presented to the ED for evaluation of abdominal pain.  Patient has a diagnosis of chronic pancreatitis with history of post ERCP pancreatitis in 2016.  She is on chronic pain regimen with oxycodone 15 mg every 6 hours as needed.  She states she had been feeling well until 3 days ago when she developed severe and persistent epigastric abdominal pain associated with nausea and vomiting.  She has not been able to maintain any adequate oral intake or take her medications.  She has been feeling dehydrated.  She states this feels similar to her previous pancreatitis flareups.  Due to her persistent symptoms she came to the ED for further evaluation.  She denies any alcohol or tobacco use.  [facility] ED Course  Labs/Imaging on admission: I have personally reviewed following labs and imaging studies.  Initial vitals showed BP 177/104, pulse 118, RR 22, temp 98.3 F, SpO2 100% on room air.  Labs show sodium 141, potassium 3.5, bicarb 17, BUN 15, creatinine 0.83, serum glucose 102, LFTs within normal limits, lipase 31, WBC 11.3, hemoglobin 15.5, platelets 234,000.  CT abdomen/pelvis without contrast was negative for acute intra-abdominal intrapelvic abnormality with limited evaluation on this noncontrast study.  Patient was given 1 L normal saline, IV Dilaudid 1 mg x 3, IV Zofran and Phenergan.  The hospitalist service was consulted to admit for further evaluation and management.  Review of Systems: All systems reviewed and are negative except as documented in history of present illness  above.   Past Medical History:  Diagnosis Date   Cancer (HCC)    cervical   GERD (gastroesophageal reflux disease)    Hypertension    Irregular heart rate    Pancreatitis     Past Surgical History:  Procedure Laterality Date   ABDOMINAL HYSTERECTOMY     ABDOMINAL SURGERY     CHOLECYSTECTOMY     implanted heart monitor      Social History:  reports that she has never smoked. She has never used smokeless tobacco. She reports that she does not drink alcohol and does not use drugs.  Allergies  Allergen Reactions   Aspirin Anaphylaxis   Iodine Shortness Of Breath   Ivp Dye [Iodinated Contrast Media] Shortness Of Breath   Ketorolac Nausea And Vomiting   Morphine And Codeine Nausea And Vomiting   Tramadol Nausea And Vomiting    History reviewed. No pertinent family history.   Prior to Admission medications   Medication Sig Start Date End Date Taking? Authorizing Provider  amLODipine (NORVASC) 10 MG tablet Take 1 tablet (10 mg total) by mouth daily. Patient not taking: Reported on 01/11/2023 11/19/22 02/17/23  [place], Eric J, DO  carvedilol (COREG) 12.5 MG tablet Take 1 tablet (12.5 mg total) by mouth 2 (two) times daily with a meal. Patient not taking: Reported on 01/11/2023 11/18/22 02/16/23  [place], Alvira Philips, DO  Eszopiclone 3 MG TABS Take 3 mg by mouth at bedtime. 10/28/22   [provider]  omeprazole (PRILOSEC) 20 MG capsule Take 20 mg by mouth daily. 08/08/17   [provider]  ondansetron (ZOFRAN) 4 MG tablet Take  1 tablet (4 mg total) by mouth every 4 (four) hours as needed for nausea or vomiting. 01/11/23   Tanda Rockers A, DO  oxyCODONE (ROXICODONE) 15 MG immediate release tablet Take 1 tablet (15 mg total) by mouth every 6 (six) hours as needed for pain. 11/18/22   [place], Alvira Philips, DO  promethazine (PHENERGAN) 25 MG tablet Take 1 tablet (25 mg total) by mouth every 6 (six) hours as needed for nausea or vomiting. 12/09/22   Renne Crigler, PA-C  sucralfate  (CARAFATE) 1 g tablet Take 1 tablet (1 g total) by mouth with breakfast, with lunch, and with evening meal for 7 days. 01/11/23 01/18/23  Sloan Leiter, DO  tiZANidine (ZANAFLEX) 4 MG tablet Take 4 mg by mouth 3 (three) times daily. 11/23/22   [provider]    Physical Exam: Vitals:   04/06/23 1600 04/06/23 1615 04/06/23 1700 04/06/23 1829  BP: (!) 163/99 (!) 175/89 (!) 170/93 (!) 195/125  Pulse: 96 100 (!) 106 (!) 101  Resp: 14 17 14 20   Temp:    98.6 F (37 C)  TempSrc:    Oral  SpO2: 98% 94% 98% 100%  Weight:    49.9 kg  Height:    5\' 3"  (1.6 m)   Constitutional: Resting in bed, somewhat uncomfortable but calm and answering questions appropriately Eyes: EOMI, lids and conjunctivae normal ENMT: Mucous membranes are dry. Posterior pharynx clear of any exudate or lesions.Normal dentition.  Neck: normal, supple, no masses. Respiratory: clear to auscultation bilaterally, no wheezing, no crackles. Normal respiratory effort. No accessory muscle use.  Cardiovascular: Regular rate and rhythm, no murmurs / rubs / gallops. No extremity edema. 2+ pedal pulses. Port left chest wall. Abdomen: Epigastric tenderness, no masses palpated.  Musculoskeletal: no clubbing / cyanosis. No joint deformity upper and lower extremities. Good ROM, no contractures. Normal muscle tone.  Skin: no rashes, lesions, ulcers. No induration Neurologic: Sensation intact. Strength 5/5 in all 4.  Psychiatric: Alert and oriented x 3. Normal mood.   EKG: Ordered and pending.  Assessment/Plan Principal Problem:   Acute on chronic pancreatitis [facility]) Active Problems:   Essential hypertension   Rachel Pitts is a 62 y.o. female with medical history significant for chronic pancreatitis with chronic pain syndrome, HTN, GERD who is admitted with acute on chronic pancreatitis.  Assessment and Plan: * Acute on chronic pancreatitis (HCC) Persistent uncontrolled epigastric pain likely related to acute on chronic  pancreatitis.  CT A/P was unrevealing.  Has not been able to maintain adequate oral intake or keep her home medicines down. -Keep n.p.o. except for sips/meds as tolerated -Continue IV fluid hydration overnight -Continue analgesics with home oxycodone 15 mg IR every 6 hours prn and IV Dilaudid 1 mg q3h prn -Continue antiemetics as needed  Essential hypertension Does not appear to be taking antihypertensives currently as an outpatient.  Med rec is still pending.  BP is elevated in setting of pain. -Continue pain control as above -IV labetalol 10 mg as needed for SBP Granocyte 180  DVT prophylaxis: enoxaparin (LOVENOX) injection 40 mg Start: 04/06/23 2200 Code Status: Full code, confirmed with patient on admission Family Communication: Discussed with patient, she has discussed with family Disposition Plan: From home pending clinical progress Consults called: None Severity of Illness: The appropriate patient status for this patient is OBSERVATION. Observation status is judged to be reasonable and necessary in order to provide the required intensity of service to ensure the patient's safety. The patient's presenting symptoms, physical exam  findings, and initial radiographic and laboratory data in the context of their medical condition is felt to place them at decreased risk for further clinical deterioration. Furthermore, it is anticipated that the patient will be medically stable for discharge from the hospital within 2 midnights of admission.   Darreld Mclean MD [facility]  If 7PM-7AM, please contact night-coverage [email or web address]  04/06/2023, 8:37 PM

## 2023-04-06 NOTE — Progress Notes (Signed)
Plan of Care Note for accepted transfer   Patient: Rachel Pitts MRN: [id number]   DOA: 04/06/2023  Facility requesting transfer: [facility] Requesting Provider: Honor Loh, PA Reason for transfer: intractable abdominal pain Facility course:  62 year old woman PMH including chronic pancreatitis presented to the ER with acute on chronic abdominal pain.  Known vomiting.  Laboratory workup unrevealing including normal lipase, CT imaging abdomen pelvis without contrast was unrevealing.  Observation was requested giving intractable abdominal pain despite multiple doses of Dilaudid.  Plan of care: The patient is accepted for admission to Med-surg  unit, at [facility]..    Author: Brendia Sacks, MD 04/06/2023  Check [email or web address] for on-call coverage.  Nursing staff, Please call [facility] Admits & Consults System-Wide number on Amion as soon as patient's arrival, so appropriate admitting provider can evaluate the pt.

## 2023-04-06 NOTE — ED Notes (Signed)
Called [facility] for transport spoke with Lauren @ 4:02

## 2023-04-06 NOTE — ED Notes (Addendum)
ED TO INPATIENT HANDOFF REPORT  ED Nurse Name and Phone #: Zimir Kittleson RN [phone]  S Name/Age/Gender Rachel Pitts 62 y.o. female Room/Bed: MH04/MH04  Code Status   Code Status: Prior  Home/SNF/Other Home Patient oriented to: self, place, time, and situation Is this baseline? Yes   Triage Complete: Triage complete  Chief Complaint Intractable abdominal pain [R10.9]  Triage Note LUQ abdominal pain for several days.  Pt states she has chronic pancreatitis and feels the same as previous attacks. Pt not able to keep anything down due to pain and N/V/D.  No known fevers.   Allergies Allergies  Allergen Reactions   Aspirin Anaphylaxis   Iodine Shortness Of Breath   Ivp Dye [Iodinated Contrast Media] Shortness Of Breath   Ketorolac Nausea And Vomiting   Morphine And Codeine Nausea And Vomiting   Tramadol Nausea And Vomiting    Level of Care/Admitting Diagnosis ED Disposition     ED Disposition  Admit   Condition  --   Comment  Hospital Area: [facility] [[id number]]  Level of Care: Med-Surg [16]  Interfacility transfer: Yes  May place patient in observation at [facility] or [facility] if equivalent level of care is available:: Yes  Covid Evaluation: Asymptomatic - no recent exposure (last 10 days) testing not required  Diagnosis: Intractable abdominal pain [720109]  Admitting Physician: Standley Brooking [[id number]]  Attending Physician: Standley Brooking [[id number]]          B Medical/Surgery History Past Medical History:  Diagnosis Date   Cancer (HCC)    cervical   GERD (gastroesophageal reflux disease)    Hypertension    Irregular heart rate    Pancreatitis    Past Surgical History:  Procedure Laterality Date   ABDOMINAL HYSTERECTOMY     ABDOMINAL SURGERY     CHOLECYSTECTOMY     implanted heart monitor       A IV Location/Drains/Wounds Patient Lines/Drains/Airways Status     Active Line/Drains/Airways     Name Placement date  Placement time Site Days   Implanted Port Left Chest --  --  Chest  --            Intake/Output Last 24 hours  Intake/Output Summary (Last 24 hours) at 04/06/2023 1514 Last data filed at 04/06/2023 1114 Gross per 24 hour  Intake 1050 ml  Output --  Net 1050 ml    Labs/Imaging Results for orders placed or performed during the hospital encounter of 04/06/23 (from the past 48 hour(s))  CBC with Differential     Status: Abnormal   Collection Time: 04/06/23  9:58 AM  Result Value Ref Range   WBC 11.3 (H) 4.0 - 10.5 K/uL   RBC 5.12 (H) 3.87 - 5.11 MIL/uL   Hemoglobin 15.5 (H) 12.0 - 15.0 g/dL   HCT 10.2 72.5 - 36.6 %   MCV 86.3 80.0 - 100.0 fL   MCH 30.3 26.0 - 34.0 pg   MCHC 35.1 30.0 - 36.0 g/dL   RDW 44.0 34.7 - 42.5 %   Platelets 274 150 - 400 K/uL   nRBC 0.0 0.0 - 0.2 %   Neutrophils Relative % 74 %   Neutro Abs 8.2 (H) 1.7 - 7.7 K/uL   Lymphocytes Relative 20 %   Lymphs Abs 2.2 0.7 - 4.0 K/uL   Monocytes Relative 6 %   Monocytes Absolute 0.7 0.1 - 1.0 K/uL   Eosinophils Relative 0 %   Eosinophils Absolute 0.0 0.0 - 0.5  K/uL   Basophils Relative 0 %   Basophils Absolute 0.0 0.0 - 0.1 K/uL   Immature Granulocytes 0 %   Abs Immature Granulocytes 0.05 0.00 - 0.07 K/uL    Comment: Performed at [facility], [address] [place], [place] [address]  Comprehensive metabolic panel     Status: Abnormal   Collection Time: 04/06/23  9:58 AM  Result Value Ref Range   Sodium 141 135 - 145 mmol/L   Potassium 3.5 3.5 - 5.1 mmol/L   Chloride 107 98 - 111 mmol/L   CO2 17 (L) 22 - 32 mmol/L   Glucose, Bld 102 (H) 70 - 99 mg/dL    Comment: Glucose reference range applies only to samples taken after fasting for at least 8 hours.   BUN 15 8 - 23 mg/dL   Creatinine, Ser 6.04 0.44 - 1.00 mg/dL   Calcium 9.9 8.9 - 54.0 mg/dL   Total Protein 8.9 (H) 6.5 - 8.1 g/dL   Albumin 4.8 3.5 - 5.0 g/dL   AST 20 15 - 41 U/L   ALT 16 0 - 44 U/L   Alkaline Phosphatase 97 38 - 126  U/L   Total Bilirubin 0.7 0.3 - 1.2 mg/dL   GFR, Estimated >98 >11 mL/min    Comment: (NOTE) Calculated using the CKD-EPI Creatinine Equation (2021)    Anion gap 17 (H) 5 - 15    Comment: Performed at [facility], [address] [place], [place] [address]  Lipase, blood     Status: None   Collection Time: 04/06/23  9:58 AM  Result Value Ref Range   Lipase 31 11 - 51 U/L    Comment: Performed at [facility], [address] [place], [place] [address]   CT ABDOMEN PELVIS WO CONTRAST  Result Date: 04/06/2023 CLINICAL DATA:  Abdominal pain, acute, nonlocalized LUQ abdominal pain for several days. Pt states she has chronic pancreatitis and feels the same as previous attacks. Pt not able to keep anything down due to pain and N/V/D. No known fevers. EXAM: CT ABDOMEN AND PELVIS WITHOUT CONTRAST TECHNIQUE: Multidetector CT imaging of the abdomen and pelvis was performed following the standard protocol without IV contrast. RADIATION DOSE REDUCTION: This exam was performed according to the departmental dose-optimization program which includes automated exposure control, adjustment of the mA and/or kV according to patient size and/or use of iterative reconstruction technique. COMPARISON:  Abdomen pelvis 01/11/2023 FINDINGS: Lower chest: Elevated left hemidiaphragm.  No acute abnormality. Hepatobiliary: No focal liver abnormality. Status post cholecystectomy. No biliary dilatation. Pancreas: No focal lesion. Normal pancreatic contour. No surrounding inflammatory changes. No main pancreatic ductal dilatation. Spleen: Normal in size without focal abnormality. Splenule noted eight Adrenals/Urinary Tract: No adrenal nodule bilaterally. No nephrolithiasis and no hydronephrosis. No definite contour-deforming renal mass. No ureterolithiasis or hydroureter. The urinary bladder is unremarkable. Stomach/Bowel: Stomach is within normal limits. No evidence of bowel wall thickening or dilatation.  Appendix appears normal. Vascular/Lymphatic: No abdominal aorta or iliac aneurysm. Mild atherosclerotic plaque of the aorta and its branches. No abdominal, pelvic, or inguinal lymphadenopathy. Reproductive: Status post hysterectomy. No adnexal masses. Other: No intraperitoneal free fluid. No intraperitoneal free gas. No organized fluid collection. Musculoskeletal: Tiny umbilical hernia. No suspicious lytic or blastic osseous lesions. No acute displaced fracture. IMPRESSION: 1. No acute intra-abdominal intrapelvic abnormality with limited evaluation on this noncontrast study. 2. Aortic Atherosclerosis (ICD10-I70.0). Electronically Signed   By: Normajean Glasgow.D.  On: 04/06/2023 10:54    Pending Labs Unresulted Labs (From admission, onward)     Start     Ordered   04/06/23 0933  Urinalysis, Routine w reflex microscopic -Urine, Clean Catch  Once,   URGENT       Question:  Specimen Source  Answer:  Urine, Clean Catch   04/06/23 0933            Vitals/Pain Today's Vitals   04/06/23 1309 04/06/23 1315 04/06/23 1330 04/06/23 1348  BP:   (!) 172/100   Pulse: 100  (!) 101   Resp: 17  (!) 22   Temp:  98.1 F (36.7 C)    TempSrc:  Oral    SpO2: 100%  100%   Weight:      Height:      PainSc:    10-Worst pain ever    Isolation Precautions No active isolations  Medications Medications  promethazine (PHENERGAN) 12.5 mg in sodium chloride 0.9 % 50 mL IVPB (0 mg Intravenous Stopped 04/06/23 1056)  HYDROmorphone (DILAUDID) injection 1 mg (1 mg Intravenous Given 04/06/23 1010)  sodium chloride 0.9 % bolus 1,000 mL (0 mLs Intravenous Stopped 04/06/23 1114)  promethazine (PHENERGAN) 25 MG/ML injection (  Return to [facility] 04/06/23 1040)  HYDROmorphone (DILAUDID) injection 1 mg (1 mg Intravenous Given 04/06/23 1114)  HYDROmorphone (DILAUDID) injection 1 mg (1 mg Intravenous Given 04/06/23 1322)    Mobility walks     Focused Assessments Pt being admitted for pain control.  Chronic  pancreatitis.    R Recommendations: See Admitting Provider Note  Report given to:   Additional Notes: Pt has periods of tachycardia and hypertension.  Gets very anxious d/t pain and HR and BP increases.

## 2023-04-06 NOTE — ED Triage Notes (Signed)
LUQ abdominal pain for several days.  Pt states she has chronic pancreatitis and feels the same as previous attacks. Pt not able to keep anything down due to pain and N/V/D.  No known fevers.

## 2023-04-06 NOTE — ED Provider Notes (Signed)
[place] [facility] Provider Note   CSN: [id number] Arrival date & time: 04/06/23  0757     History Chief Complaint  Patient presents with   Abdominal Pain    Rachel Pitts is a 62 y.o. female patient with history of chronic pancreatitis who presents to the emergency department with diffuse abdominal pain has been ongoing for 2 weeks.  Patient states this feels like her pancreatitis acting up as she gets symptoms periodically.  Pancreatitis is due to a surgical mishap from a cholecystectomy many years ago.  She reports associated nausea, vomiting, and diarrhea.  She denies any fever, chills, cough, urinary symptoms.   Abdominal Pain      Home Medications Prior to Admission medications   Medication Sig Start Date End Date Taking? Authorizing Provider  amLODipine (NORVASC) 10 MG tablet Take 1 tablet (10 mg total) by mouth daily. Patient not taking: Reported on 01/11/2023 11/19/22 02/17/23  [place], Eric J, DO  carvedilol (COREG) 12.5 MG tablet Take 1 tablet (12.5 mg total) by mouth 2 (two) times daily with a meal. Patient not taking: Reported on 01/11/2023 11/18/22 02/16/23  [place], Alvira Philips, DO  Eszopiclone 3 MG TABS Take 3 mg by mouth at bedtime. 10/28/22   [provider]  omeprazole (PRILOSEC) 20 MG capsule Take 20 mg by mouth daily. 08/08/17   [provider]  ondansetron (ZOFRAN) 4 MG tablet Take 1 tablet (4 mg total) by mouth every 4 (four) hours as needed for nausea or vomiting. 01/11/23   Tanda Rockers A, DO  oxyCODONE (ROXICODONE) 15 MG immediate release tablet Take 1 tablet (15 mg total) by mouth every 6 (six) hours as needed for pain. 11/18/22   [place], Alvira Philips, DO  promethazine (PHENERGAN) 25 MG tablet Take 1 tablet (25 mg total) by mouth every 6 (six) hours as needed for nausea or vomiting. 12/09/22   Renne Crigler, PA-C  sucralfate (CARAFATE) 1 g tablet Take 1 tablet (1 g total) by mouth with breakfast, with lunch, and with  evening meal for 7 days. 01/11/23 01/18/23  Sloan Leiter, DO  tiZANidine (ZANAFLEX) 4 MG tablet Take 4 mg by mouth 3 (three) times daily. 11/23/22   [provider]      Allergies    Aspirin, Iodine, Ivp dye [iodinated contrast media], Ketorolac, Morphine and codeine, and Tramadol    Review of Systems   Review of Systems  Gastrointestinal:  Positive for abdominal pain.  All other systems reviewed and are negative.   Physical Exam Updated Vital Signs BP (!) 172/100   Pulse (!) 101   Temp 98.1 F (36.7 C) (Oral)   Resp (!) 22   Ht 5' (1.524 m)   Wt 49.9 kg   SpO2 100%   BMI 21.48 kg/m  Physical Exam Vitals and nursing note reviewed.  Constitutional:      General: She is in acute distress.     Appearance: Normal appearance.     Comments: Appears uncomfortable.  HENT:     Head: Normocephalic and atraumatic.  Eyes:     General:        Right eye: No discharge.        Left eye: No discharge.  Cardiovascular:     Rate and Rhythm: Tachycardia present. No extrasystoles are present.    Pulses: Normal pulses.     Heart sounds: Normal heart sounds.  Pulmonary:     Effort: Tachypnea present.     Breath sounds: Normal  breath sounds.  Abdominal:     General: Abdomen is flat. Bowel sounds are normal. There is no distension.     Tenderness: There is generalized abdominal tenderness. There is no guarding or rebound.  Musculoskeletal:        General: Normal range of motion.     Cervical back: Neck supple.  Skin:    General: Skin is warm and dry.     Findings: No rash.  Neurological:     General: No focal deficit present.     Mental Status: She is alert.  Psychiatric:        Mood and Affect: Mood normal.        Behavior: Behavior normal.     ED Results / Procedures / Treatments   Labs (all labs ordered are listed, but only abnormal results are displayed) Labs Reviewed  CBC WITH DIFFERENTIAL/PLATELET - Abnormal; Notable for the following components:      Result  Value   WBC 11.3 (*)    RBC 5.12 (*)    Hemoglobin 15.5 (*)    Neutro Abs 8.2 (*)    All other components within normal limits  COMPREHENSIVE METABOLIC PANEL - Abnormal; Notable for the following components:   CO2 17 (*)    Glucose, Bld 102 (*)    Total Protein 8.9 (*)    Anion gap 17 (*)    All other components within normal limits  LIPASE, BLOOD  URINALYSIS, ROUTINE W REFLEX MICROSCOPIC    EKG None  Radiology CT ABDOMEN PELVIS WO CONTRAST  Result Date: 04/06/2023 CLINICAL DATA:  Abdominal pain, acute, nonlocalized LUQ abdominal pain for several days. Pt states she has chronic pancreatitis and feels the same as previous attacks. Pt not able to keep anything down due to pain and N/V/D. No known fevers. EXAM: CT ABDOMEN AND PELVIS WITHOUT CONTRAST TECHNIQUE: Multidetector CT imaging of the abdomen and pelvis was performed following the standard protocol without IV contrast. RADIATION DOSE REDUCTION: This exam was performed according to the departmental dose-optimization program which includes automated exposure control, adjustment of the mA and/or kV according to patient size and/or use of iterative reconstruction technique. COMPARISON:  Abdomen pelvis 01/11/2023 FINDINGS: Lower chest: Elevated left hemidiaphragm.  No acute abnormality. Hepatobiliary: No focal liver abnormality. Status post cholecystectomy. No biliary dilatation. Pancreas: No focal lesion. Normal pancreatic contour. No surrounding inflammatory changes. No main pancreatic ductal dilatation. Spleen: Normal in size without focal abnormality. Splenule noted eight Adrenals/Urinary Tract: No adrenal nodule bilaterally. No nephrolithiasis and no hydronephrosis. No definite contour-deforming renal mass. No ureterolithiasis or hydroureter. The urinary bladder is unremarkable. Stomach/Bowel: Stomach is within normal limits. No evidence of bowel wall thickening or dilatation. Appendix appears normal. Vascular/Lymphatic: No abdominal aorta  or iliac aneurysm. Mild atherosclerotic plaque of the aorta and its branches. No abdominal, pelvic, or inguinal lymphadenopathy. Reproductive: Status post hysterectomy. No adnexal masses. Other: No intraperitoneal free fluid. No intraperitoneal free gas. No organized fluid collection. Musculoskeletal: Tiny umbilical hernia. No suspicious lytic or blastic osseous lesions. No acute displaced fracture. IMPRESSION: 1. No acute intra-abdominal intrapelvic abnormality with limited evaluation on this noncontrast study. 2. Aortic Atherosclerosis (ICD10-I70.0). Electronically Signed   By: Tish Frederickson M.D.   On: 04/06/2023 10:54    Procedures Procedures    Medications Ordered in ED Medications  promethazine (PHENERGAN) 12.5 mg in sodium chloride 0.9 % 50 mL IVPB (0 mg Intravenous Stopped 04/06/23 1056)  HYDROmorphone (DILAUDID) injection 1 mg (1 mg Intravenous Given 04/06/23 1010)  sodium chloride  0.9 % bolus 1,000 mL (0 mLs Intravenous Stopped 04/06/23 1114)  promethazine (PHENERGAN) 25 MG/ML injection (  Return to [facility] 04/06/23 1040)  HYDROmorphone (DILAUDID) injection 1 mg (1 mg Intravenous Given 04/06/23 1114)  HYDROmorphone (DILAUDID) injection 1 mg (1 mg Intravenous Given 04/06/23 1322)    ED Course/ Medical Decision Making/ A&P Clinical Course as of 04/06/23 1403  Thu Apr 06, 2023  1308 Patient's vital signs do appear to be abnormal.  However, patient is hyperventilating in the room and gripping the bed which is likely causing these abnormalities in her vital signs.  Patient is constantly requesting additional pain medication.  Chart review does reveal that the patient is here often for similar complaints. [CF]  1358 I spoke with Dr. Irene Limbo with [facility] who agrees to admit the patient for pain control and observation. [CF]  1358 CBC with Differential(!) There is evidence of leukocytosis which is new in comparison to previous.  [CF]  1402 Comprehensive metabolic panel(!) No  significant electrolyte derangements. [CF]  1402 Lipase, blood Normal. [CF]    Clinical Course User Index [CF] Teressa Lower, PA-C   {   Click here for ABCD2, HEART and other calculators  Medical Decision Making Katiya Crompton is a 62 y.o. female patient who presents to the emergency department today for further evaluation of abdominal pain.  Patient is tachycardic, tachypneic, and hypertensive likely secondary to pain.  She appears very uncomfortable and has moderate abdominal tenderness with minimal palpation.  Abdomen is soft and not rigid.  There is no guarding or rebound.  Given the patient's history I will likely get a CT scan without contrast of the patient does have a contrast allergy give her some pain medication, antiemetics, and get some other abdominal labs to further assess.  Patient has had 3 rounds of Dilaudid and still in a significant mount of pain.  Although her CT scan does not reveal any significant acute abdominal pathology I do feel the patient would likely benefit from admission for further pain control.  I will speak to the hospitalist to get her admitted.  Patient in agreement.  Amount and/or Complexity of Data Reviewed Labs: ordered. Radiology: ordered.  Risk Prescription drug management. Decision regarding hospitalization.    Final Clinical Impression(s) / ED Diagnoses Final diagnoses:  Chronic abdominal pain    Rx / DC Orders ED Discharge Orders     None         Teressa Lower, [place] 04/06/23 1403    Arby Barrette, MD 04/07/23 [phone]

## 2023-04-06 NOTE — Hospital Course (Addendum)
HPI: Rachel Pitts is a 62 y.o. female with medical history significant for chronic pancreatitis with chronic pain syndrome, HTN, GERD who presented to the ED for evaluation of abdominal pain.   Patient has a diagnosis of chronic pancreatitis with history of post ERCP pancreatitis in 2016.  She is on chronic pain regimen with oxycodone 15 mg every 6 hours as needed.   She states she had been feeling well until 3 days ago when she developed severe and persistent epigastric abdominal pain associated with nausea and vomiting.  She has not been able to maintain any adequate oral intake or take her medications.  She has been feeling dehydrated.  She states this feels similar to her previous pancreatitis flareups.  Due to her persistent symptoms she came to the ED for further evaluation.  She denies any alcohol or tobacco use.    Significant Events: Admitted 04/06/2023   Significant Labs: 04-06-2023 Lipase normal at 31 04-07-2023 lipase normal at 31 04-08-2023 lipase normal at 27 Fecal pancreatic elastase PENDING(workup for pancreatic insufficiency)  Significant Imaging Studies: CT abd negative for pancreatic inflammation. Prior cholecystectomy. 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

## 2023-04-07 DIAGNOSIS — K859 Acute pancreatitis without necrosis or infection, unspecified: Secondary | ICD-10-CM | POA: Diagnosis not present

## 2023-04-07 DIAGNOSIS — K861 Other chronic pancreatitis: Secondary | ICD-10-CM | POA: Diagnosis not present

## 2023-04-07 DIAGNOSIS — Z885 Allergy status to narcotic agent status: Secondary | ICD-10-CM | POA: Diagnosis not present

## 2023-04-07 DIAGNOSIS — Z9049 Acquired absence of other specified parts of digestive tract: Secondary | ICD-10-CM | POA: Diagnosis not present

## 2023-04-07 DIAGNOSIS — I10 Essential (primary) hypertension: Secondary | ICD-10-CM | POA: Diagnosis present

## 2023-04-07 DIAGNOSIS — Z888 Allergy status to other drugs, medicaments and biological substances status: Secondary | ICD-10-CM | POA: Diagnosis not present

## 2023-04-07 DIAGNOSIS — R109 Unspecified abdominal pain: Secondary | ICD-10-CM | POA: Diagnosis present

## 2023-04-07 DIAGNOSIS — Z765 Malingerer [conscious simulation]: Secondary | ICD-10-CM | POA: Diagnosis not present

## 2023-04-07 DIAGNOSIS — R1084 Generalized abdominal pain: Secondary | ICD-10-CM | POA: Diagnosis present

## 2023-04-07 DIAGNOSIS — K219 Gastro-esophageal reflux disease without esophagitis: Secondary | ICD-10-CM | POA: Diagnosis present

## 2023-04-07 DIAGNOSIS — I959 Hypotension, unspecified: Secondary | ICD-10-CM | POA: Diagnosis present

## 2023-04-07 DIAGNOSIS — G8929 Other chronic pain: Principal | ICD-10-CM | POA: Diagnosis present

## 2023-04-07 DIAGNOSIS — Z91041 Radiographic dye allergy status: Secondary | ICD-10-CM | POA: Diagnosis not present

## 2023-04-07 DIAGNOSIS — E876 Hypokalemia: Secondary | ICD-10-CM | POA: Diagnosis not present

## 2023-04-07 DIAGNOSIS — G894 Chronic pain syndrome: Secondary | ICD-10-CM | POA: Diagnosis present

## 2023-04-07 DIAGNOSIS — Z79899 Other long term (current) drug therapy: Secondary | ICD-10-CM | POA: Diagnosis not present

## 2023-04-07 DIAGNOSIS — R064 Hyperventilation: Secondary | ICD-10-CM | POA: Diagnosis present

## 2023-04-07 LAB — BASIC METABOLIC PANEL
Anion gap: 10 (ref 5–15)
BUN: 12 mg/dL (ref 8–23)
CO2: 23 mmol/L (ref 22–32)
Calcium: 8.2 mg/dL — ABNORMAL LOW (ref 8.9–10.3)
Chloride: 106 mmol/L (ref 98–111)
Creatinine, Ser: 0.93 mg/dL (ref 0.44–1.00)
GFR, Estimated: >60 mL/min (ref >60)
Glucose, Bld: 101 mg/dL — ABNORMAL HIGH (ref 70–99)
Potassium: 3.2 mmol/L — ABNORMAL LOW (ref 3.5–5.1)
Sodium: 139 mmol/L (ref 135–145)

## 2023-04-07 LAB — CBC
HCT: 34.9 % — ABNORMAL LOW (ref 36.0–46.0)
Hemoglobin: 11.6 g/dL — ABNORMAL LOW (ref 12.0–15.0)
MCH: 30.9 pg (ref 26.0–34.0)
MCHC: 33.2 g/dL (ref 30.0–36.0)
MCV: 92.8 fL (ref 80.0–100.0)
Platelets: 221 10*3/uL (ref 150–400)
RBC: 3.76 MIL/uL — ABNORMAL LOW (ref 3.87–5.11)
RDW: 12.1 % (ref 11.5–15.5)
WBC: 7.6 10*3/uL (ref 4.0–10.5)
nRBC: 0 % (ref 0.0–0.2)

## 2023-04-07 LAB — LIPASE, BLOOD: Lipase: 31 U/L (ref 11–51)

## 2023-04-07 MED ORDER — FENTANYL CITRATE PF 50 MCG/ML IJ SOSY
12.5000 ug | PREFILLED_SYRINGE | Freq: Once | INTRAMUSCULAR | Status: AC
  Administered 2023-04-07: 12.5 ug via INTRAVENOUS
  Filled 2023-04-07: qty 1

## 2023-04-07 MED ORDER — HYDROMORPHONE HCL 1 MG/ML IJ SOLN
0.5000 mg | INTRAMUSCULAR | Status: DC | PRN
  Administered 2023-04-07 – 2023-04-08 (×3): 0.5 mg via INTRAVENOUS
  Filled 2023-04-07 (×3): qty 0.5

## 2023-04-07 MED ORDER — AMLODIPINE BESYLATE 10 MG PO TABS
10.0000 mg | ORAL_TABLET | Freq: Every day | ORAL | Status: DC
  Administered 2023-04-07 – 2023-04-09 (×2): 10 mg via ORAL
  Filled 2023-04-07 (×2): qty 1

## 2023-04-07 MED ORDER — SODIUM CHLORIDE 0.9 % IV BOLUS
1000.0000 mL | Freq: Once | INTRAVENOUS | Status: AC
  Administered 2023-04-07: 1000 mL via INTRAVENOUS

## 2023-04-07 MED ORDER — SODIUM CHLORIDE 0.9% FLUSH: 10.0000 mL | INTRAVENOUS | Status: DC | PRN

## 2023-04-07 MED ORDER — LACTATED RINGERS IV SOLN: INTRAVENOUS | Status: DC

## 2023-04-07 MED ORDER — CHLORHEXIDINE GLUCONATE CLOTH 2 % EX PADS
6.0000 | MEDICATED_PAD | Freq: Every day | CUTANEOUS | Status: DC
  Administered 2023-04-07 – 2023-04-09 (×3): 6 via TOPICAL

## 2023-04-07 MED ORDER — MELATONIN 5 MG PO TABS
5.0000 mg | ORAL_TABLET | Freq: Once | ORAL | Status: AC
  Administered 2023-04-07: 5 mg via ORAL
  Filled 2023-04-07: qty 1

## 2023-04-07 MED ORDER — OXYCODONE HCL 5 MG PO TABS
30.0000 mg | ORAL_TABLET | Freq: Four times a day (QID) | ORAL | Status: DC | PRN
  Administered 2023-04-07 (×2): 30 mg via ORAL
  Filled 2023-04-07 (×2): qty 6

## 2023-04-07 MED ORDER — SODIUM CHLORIDE 0.9% FLUSH
10.0000 mL | Freq: Two times a day (BID) | INTRAVENOUS | Status: DC
  Administered 2023-04-07 – 2023-04-09 (×4): 10 mL

## 2023-04-07 MED ORDER — CARVEDILOL 12.5 MG PO TABS
12.5000 mg | ORAL_TABLET | Freq: Two times a day (BID) | ORAL | Status: DC
  Administered 2023-04-07: 12.5 mg via ORAL
  Filled 2023-04-07: qty 1

## 2023-04-07 NOTE — Plan of Care (Signed)
  Problem: Education: Goal: Knowledge of General Education information will improve Description: Including pain rating scale, medication(s)/side effects and non-pharmacologic comfort measures Outcome: Progressing   Problem: Clinical Measurements: Goal: Ability to maintain clinical measurements within normal limits will improve Outcome: Progressing Goal: Will remain free from infection Outcome: Progressing   Problem: Pain Managment: Goal: General experience of comfort will improve Outcome: Progressing   Problem: Safety: Goal: Ability to remain free from injury will improve Outcome: Progressing   Problem: Skin Integrity: Goal: Risk for impaired skin integrity will decrease Outcome: Progressing

## 2023-04-07 NOTE — Progress Notes (Signed)
   04/07/23 1227  TOC Brief Assessment  Insurance and Status Reviewed  Patient has primary care physician Yes ([facility])  Home environment has been reviewed Yes (home with spouse)  Prior level of function: Independent  Prior/Current Home Services No current home services  Social Determinants of Health Reivew SDOH reviewed no interventions necessary  Readmission risk has been reviewed Yes  Transition of care needs no transition of care needs at this time

## 2023-04-07 NOTE — Progress Notes (Addendum)
PROGRESS NOTE    Rachel Pitts  ZOX:[id number] DOB: 10-Apr-1961 DOA: 04/06/2023 PCP: [facility]  Subjective: Pt seen and examined. Still c/o of pain. Pt seen with her bedside RN as a witness. Pt goes to [facility] for her oxycodone. Yet has not established with the GI specialist at [facility] to care for her "chronic pancreatitis". After review of her labs, pt with only 1 instances of lipase of 102 on  May 22, 2019. This was found in Care Everywhere.  All other lipases during her "pancreatitis" flares have all been NORMAL.  No CT abd going back as far as 03-2018 never shows any signs of chronic pancreatitis such as pancreatic calcifications, pancreatic atrophy, etc.  She has already had 6 CT abd/pelvis within [facility] in [facility]24 that all show normal pancreas and normal lipases despite pt's c/o of abd pain related to her pancreas.   Hospital Course: HPI: Rachel Pitts is a 61 y.o. female with medical history significant for chronic pancreatitis with chronic pain syndrome, HTN, GERD who presented to the ED for evaluation of abdominal pain.   Patient has a diagnosis of chronic pancreatitis with history of post ERCP pancreatitis in 2016.  She is on chronic pain regimen with oxycodone 15 mg every 6 hours as needed.   She states she had been feeling well until 3 days ago when she developed severe and persistent epigastric abdominal pain associated with nausea and vomiting.  She has not been able to maintain any adequate oral intake or take her medications.  She has been feeling dehydrated.  She states this feels similar to her previous pancreatitis flareups.  Due to her persistent symptoms she came to the ED for further evaluation.  She denies any alcohol or tobacco use.    Significant Events: Admitted 04/06/2023   Significant Labs: 04-06-2023 Lipase normal at 31  Significant Imaging Studies: CT abd negative for pancreatic inflammation. Prior cholecystectomy. 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  CT A/P was unrevealing.  Has not been able to maintain adequate oral intake or keep her home medicines down.  I have extensively reviewed her CareEverywhere notes from [facility] in [place], [facility] in [place], [address]. Pt has never has CT or MRI abd that shows acute pancreatitis. When pt was in [place], there was concern by outpatient pain clinic about pt's validity for continuing opiate medications. That outpatient [place] pain clinic declined to continue to prescribe pt's opiates. Pt has established herself with [facility] for her oxycodone but interestingly has not sought out GI assistance from [facility] center.  Pt states she has had celiac blocks in the past that were ineffective.  There is mention in CareEverywhere about pt have hyperalgesia due to opiates. Pt unwilling to stop her oxycodone.  Pt mentions that she had her gallbladder removed in [place] in 1990s. And that the surgeon "messed" up her surgery.  Unfortunately, those records are not available to review.  Discussed with pt that IV dilaudid dose would be decreased. This was in the presence of her bedside nurse. Will temporarily increase her po oxycodone to 30 mg q6h prn. Pt has valid Rx for oxycodone 15 mg #120 that was  just filled on 03-26-2023. Will decrease IV dilaudid to 0.5 mg q4h prn. Overnight providers should not adjust dilaudid dose.  Will check fecal pancreatic elastase to workup for pancreatic insufficiency.  I suspect that pt's diagnosis of chronic pancreatic is likely not true and pt had chronic abd pain vs opiate use disorder.  Essential  hypertension Does not appear to be taking antihypertensives currently as an outpatient. IV labetalol 10 mg as needed for SBP >170 Start coreg and norvasc.  Hypokalemia Replete with oral Kcl. Repeat BMP in AM.       DVT prophylaxis: enoxaparin (LOVENOX) injection 40 mg Start: 04/06/23 2200    Code Status: Full Code Family Communication: no family at bedside Disposition Plan: return home Reason for continuing need for hospitalization: on IVF, checking fecal pancreatic elastase for pancreatic insufficiency workup.  Objective: Vitals:   04/06/23 2218 04/07/23 0234 04/07/23 0636 04/07/23 1343  BP: (!) 168/96 (!) 163/87 (!) 159/90 (!) 183/106  Pulse: 95 97 96 (!) 102  Resp: 20 18 20 20   Temp: 98.3 F (36.8 C) 98.7 F (37.1 C) 98.3 F (36.8 C) 99.1 F (37.3 C)  TempSrc:  Oral Oral Oral  SpO2: 99% 98% 98% 98%  Weight:      Height:        Intake/Output Summary (Last 24 hours) at 04/07/2023 1507 Last data filed at 04/07/2023 0743 Gross per 24 hour  Intake 0 ml  Output --  Net 0 ml   Filed Weights   04/06/23 0819 04/06/23 1829  Weight: 49.9 kg 49.9 kg    Examination:  Physical Exam Vitals and nursing note reviewed.  Constitutional:      General: She is not in acute distress.    Appearance: She is not toxic-appearing.  HENT:     Head: Normocephalic and atraumatic.     Nose: Nose normal.  Cardiovascular:     Rate and Rhythm: Normal rate and regular rhythm.  Pulmonary:     Effort: Pulmonary effort is normal.     Breath sounds: Normal breath sounds.  Abdominal:     General: Bowel sounds are normal. There is no distension.     Palpations: Abdomen is soft.     Tenderness: There is generalized abdominal tenderness. There is no guarding or rebound.  Musculoskeletal:     Right lower leg: No edema.     Left lower leg: No edema.  Skin:    General: Skin is warm and dry.     Capillary Refill: Capillary refill takes less than 2 seconds.  Neurological:     General: No  focal deficit present.     Mental Status: She is alert and oriented to person, place, and time.  Psychiatric:        Mood and Affect: Mood is anxious.     Data Reviewed: I have personally reviewed following labs and imaging studies  CBC: Recent Labs  Lab 04/06/23 0958 04/07/23 0601  WBC 11.3* 7.6  NEUTROABS 8.2*  --   HGB 15.5* 11.6*  HCT 44.2 34.9*  MCV 86.3 92.8  PLT 274 221   Basic Metabolic Panel: Recent Labs  Lab 04/06/23 0958 04/07/23 0601  NA 141 139  K 3.5 3.2*  CL 107 106  CO2 17* 23  GLUCOSE 102* 101*  BUN 15 12  CREATININE 0.83 0.93  CALCIUM 9.9 8.2*   GFR: Estimated Creatinine Clearance: 50 mL/min (by C-G formula based on SCr of 0.93 mg/dL). Liver Function Tests: Recent Labs  Lab 04/06/23 [phone]  AST 20  ALT 16  ALKPHOS 97  BILITOT 0.7  PROT 8.9*  ALBUMIN 4.8   Recent Labs  Lab 04/06/23 0958 04/07/23 0601  LIPASE 31 31    Radiology Studies: CT ABDOMEN PELVIS WO CONTRAST  Result Date: 04/06/2023 CLINICAL DATA:  Abdominal pain, acute, nonlocalized LUQ abdominal pain for several days. Pt states she has chronic pancreatitis and feels the same as previous attacks. Pt not able to keep anything down due to pain and N/V/D. No known fevers. EXAM: CT ABDOMEN AND PELVIS WITHOUT CONTRAST TECHNIQUE: Multidetector CT imaging of the abdomen and pelvis was performed following the standard protocol without IV contrast. RADIATION DOSE REDUCTION: This exam was performed according to the departmental dose-optimization program which includes automated exposure control, adjustment of the mA and/or kV according to patient size and/or use of iterative reconstruction technique. COMPARISON:  Abdomen pelvis 01/11/2023 FINDINGS: Lower chest: Elevated left hemidiaphragm.  No acute abnormality. Hepatobiliary: No focal liver abnormality. Status post cholecystectomy. No biliary dilatation. Pancreas: No focal lesion. Normal pancreatic contour. No surrounding inflammatory changes.  No main pancreatic ductal dilatation. Spleen: Normal in size without focal abnormality. Splenule noted eight Adrenals/Urinary Tract: No adrenal nodule bilaterally. No nephrolithiasis and no hydronephrosis. No definite contour-deforming renal mass. No ureterolithiasis or hydroureter. The urinary bladder is unremarkable. Stomach/Bowel: Stomach is within normal limits. No evidence of bowel wall thickening or dilatation. Appendix appears normal. Vascular/Lymphatic: No abdominal aorta or iliac aneurysm. Mild atherosclerotic plaque of the aorta and its branches. No abdominal, pelvic, or inguinal lymphadenopathy. Reproductive: Status post hysterectomy. No adnexal masses. Other: No intraperitoneal free fluid. No intraperitoneal free gas. No organized fluid collection. Musculoskeletal: Tiny umbilical hernia. No suspicious lytic or blastic osseous lesions. No acute displaced fracture. IMPRESSION: 1. No acute intra-abdominal intrapelvic abnormality with limited evaluation on this noncontrast study. 2. Aortic Atherosclerosis (ICD10-I70.0). Electronically Signed   By: Tish Frederickson M.D.   On: 04/06/2023 10:54    Scheduled Meds:  amLODipine  10 mg Oral Daily   carvedilol  12.5 mg Oral BID WC   Chlorhexidine Gluconate Cloth  6 each Topical Daily   enoxaparin (LOVENOX) injection  40 mg Subcutaneous Q24H   pantoprazole  40 mg Oral Daily   sodium chloride flush  10-40 mL Intracatheter Q12H   zolpidem  5 mg Oral QHS   Continuous Infusions:  lactated ringers 100 mL/hr at 04/07/23 1343   promethazine (PHENERGAN) injection (IM or IVPB) Stopped (04/06/23 1056)     LOS: 0 days   Time spent: 45 minutes  Carollee Herter, DO  [facility]  04/07/2023, 3:07 PM

## 2023-04-07 NOTE — Progress Notes (Signed)
MEWS Progress Note  Patient Details Name: Tashuna Hodum MRN: [id number] DOB: 06/12/1961 Today's Date: 04/07/2023   MEWS Flowsheet Documentation:  Assess: MEWS Score Temp: 98.6 F (37 C) BP: (!) 82/58 MAP (mmHg): 66 Pulse Rate: 73 ECG Heart Rate: (!) 106 Resp: 14 Level of Consciousness: Alert SpO2: 99 % O2 Device: Room Air Assess: MEWS Score MEWS Temp: 0 MEWS Systolic: 1 MEWS Pulse: 0 MEWS RR: 0 MEWS LOC: 0 MEWS Score: 1 MEWS Score Color: Green Assess: SIRS CRITERIA SIRS Temperature : 0 SIRS Respirations : 0 SIRS Pulse: 0 SIRS WBC: 0 SIRS Score Sum : 0 SIRS Temperature : 0 SIRS Pulse: 0 SIRS Respirations : 0 SIRS WBC: 0 SIRS Score Sum : 0 Assess: if the MEWS score is Yellow or Red Were vital signs accurate and taken at a resting state?: Yes Does the patient meet 2 or more of the SIRS criteria?: No MEWS guidelines implemented : Yes, yellow Treat MEWS Interventions: Considered administering scheduled or prn medications/treatments as ordered Take Vital Signs Increase Vital Sign Frequency : Yellow: Q2hr x1, continue Q4hrs until patient remains green for 12hrs Escalate MEWS: Escalate: Yellow: Discuss with charge nurse and consider notifying provider and/or RRT Notify: Charge Nurse/RN Name of Charge Nurse/RN Notified: Vera, RN Provider Notification Provider Name/Title: Anthoney Harada, NP Date Provider Notified: 04/07/23 Time Provider Notified: 2048 Method of Notification: Call Notification Reason: Change in status (Hypotensive) Provider response: See new orders, En route (1L Fluid Bolus) Date of Provider Response: 04/07/23 Time of Provider Response: 2050 Notify: Rapid Response Name of Rapid Response RN Notified: Janelle, RN Date Rapid Response Notified: 04/07/23 Time Rapid Response Notified: 2055   Pt hypotensive BP 67/48. Verified on both arms and with manual BP cuff. Pt alert and oriented, no complaints of dizziness, lightheadedness, or double vision. Pts  respiratory rate stable. Pt placed in trendelenburg position. Pt asymptomatic at time of event. Overnight provider Anthoney Harada, NP made aware. Orders placed for 1 liter fluid bolus and provider up at bedside to assess. Rapid Response RN Janelle made aware of situation as well. [place] implemented. Will continue to closely monitor.   [place] Siren Angenette Daily 04/07/2023, 9:09 PM

## 2023-04-07 NOTE — Plan of Care (Signed)

## 2023-04-07 NOTE — Subjective & Objective (Addendum)
Pt seen and examined. Still c/o of pain. Pt seen with her bedside RN as a witness. Pt goes to [facility] for her oxycodone. Yet has not established with the GI specialist at [facility] to care for her "chronic pancreatitis". After review of her labs, pt with only 1 instances of lipase of 102 on  May 22, 2019. This was found in Care Everywhere.  All other lipases during her "pancreatitis" flares have all been NORMAL.  No CT abd going back as far as 03-2018 never shows any signs of chronic pancreatitis such as pancreatic calcifications, pancreatic atrophy, etc.  She has already had 6 CT abd/pelvis within [facility] in 2024 that all show normal pancreas and normal lipases despite pt's c/o of abd pain related to her pancreas.

## 2023-04-07 NOTE — Assessment & Plan Note (Signed)
Resolved after Repletion with oral Kcl

## 2023-04-08 DIAGNOSIS — R109 Unspecified abdominal pain: Secondary | ICD-10-CM | POA: Diagnosis not present

## 2023-04-08 DIAGNOSIS — Z765 Malingerer [conscious simulation]: Secondary | ICD-10-CM | POA: Diagnosis not present

## 2023-04-08 DIAGNOSIS — R1084 Generalized abdominal pain: Secondary | ICD-10-CM

## 2023-04-08 DIAGNOSIS — I10 Essential (primary) hypertension: Secondary | ICD-10-CM | POA: Diagnosis not present

## 2023-04-08 DIAGNOSIS — G8929 Other chronic pain: Secondary | ICD-10-CM

## 2023-04-08 MED ORDER — OXYCODONE HCL 5 MG PO TABS
20.0000 mg | ORAL_TABLET | Freq: Four times a day (QID) | ORAL | Status: DC | PRN
  Administered 2023-04-08 – 2023-04-09 (×4): 20 mg via ORAL
  Filled 2023-04-08 (×4): qty 4

## 2023-04-08 MED ORDER — POTASSIUM CHLORIDE CRYS ER 20 MEQ PO TBCR
40.0000 meq | EXTENDED_RELEASE_TABLET | ORAL | Status: AC
  Administered 2023-04-08 (×2): 40 meq via ORAL
  Filled 2023-04-08 (×2): qty 2

## 2023-04-08 NOTE — Assessment & Plan Note (Signed)
See overview for chronic abd pain

## 2023-04-08 NOTE — Assessment & Plan Note (Signed)
Condensed version: Is that GI specialist did not think pt has chronic pancreatitis but rather chronic abd pain and hypnoanalgesia from too much opiates.  Pt has only had documented pancreatitis AFTER a ERCP procedure. No imaging has ever shown to her acute pancreatic inflammation during one of her "pancreatic flares".  Nor does she show any signs of chronic pancreatitis such as pancreatic calcifications or pancreatic atrophy.     From GI consult note in 2016 at [facility]: Read Dr. Audelia Hives entire impression here:(from office visit 06-02-2015) Ms. Rachel [facility] is a very pleasant 62 year old woman who carries a diagnosis of chronic pancreatitis and has undergone extensive endotherapy for this condition and for pancreas divisum. She presents to clinic with what she describes as progressively worsening recurrence of her pancreatic type abdominal pain.   I had an extensive discussion with Ms. Biehn in clinic regarding her abdominal pain syndrome. We discussed that ERCP has clearly not been a silver bullet in terms of durable response and is unlikely to represent something that will significantly impact her natural history in the Mapes run. We did discuss, however, that her improvement for approximately six years after Dr. Orlene Erm' ERCP with recent recurrence of symptoms does suggest the possibility of minor papilla restenosis or a pancreatic duct stricture and, on this basis, an ERCP could be considered, provided she understands the limitations and risks of this procedure. We discussed that, in addition to ERCP, I believe it is important to perform an endoscopic ultrasound to determine the presence and severity of chronic pancreatitis, which may have implications in future management, including the possibility of a total pancreatectomy with islet autotransplantation.   I performed an endoscopic ultrasound and ERCP on Ms. Mcqueary this week. During EUS, we identified some hyperechoic foci and non-honeycombed lobularity in  the pancreas. Her pancreatic duct was mildly irregular. Overall, these changes were not very prominent. Although they were indeterminate for a diagnosis of chronic pancreatitis by Rose[place] criteria, they certainly did not represent a severe form of chronic pancreatitis that I expected on the basis of her symptom complex. 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 Her upstream pancreatic duct was slightly dilated with some irregularity and prominent side branches. We placed a 7 Fr stent across the minor papilla into her bile duct, over which I performed a needle-knife minor papillotomy extension. This procedure was complicated by severe abdominal pain, and radiographically mild post-ERCP pancreatitis.   Overall, based on EUS and ERCP, I do believe that Ms. Pellum probably does have some element of chronic pancreatitis, although I am not certain that this is the primary driving process rather than a consequence of repeated ERCPs with prolonged pancreatic ductal stenting.   In either case, it seems unlikely that her debilitating and narcotic-requiring abdominal pain is solely the result of pancreatic pathology. 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 Of note is that within 2 hours of my initial clinic visit Mrs Duhe called our office twice for a prescription.   Certainly, you may want to consider these entities in your Murry-term strategy for treating her abdominal pain moving forward if minor papilla stenting does not result in substantial improvement. I had discussed with Ms. Libman total pancreatectomy and islet autotransplantation, and please fee  at liberty to refer Ms. Bula to Dr. Katina Degree at [facility] for  consideration of this intervention, although based on the appearance of her pancreas and the characteristics of her pain, I wonder whether she will continue to have significant abdominal pain after such intervention.  Thank you very much for allowing me to participate in the care of this very pleasant woman. Feel free to contact me via Meduline with any additional questions or concerns.  Sincerely,  B. Ponciano Ort, MD Associate Professor of Medicine [address], [facility] [address] [place], [place] [address]

## 2023-04-08 NOTE — Plan of Care (Signed)

## 2023-04-08 NOTE — Progress Notes (Addendum)
Patient Name: Chaelyn Montoy           DOB: November 19, 1960  MRN: [id number]      Admission Date: 04/06/2023  Attending Provider: Carollee Herter, DO  Primary Diagnosis: Acute on chronic pancreatitis [facility])   Level of care: Med-Surg    CROSS COVER NOTE   Date of Service   04/08/2023   Vira Blanco, 62 y.o. female, was admitted on 04/06/2023 for Acute on chronic pancreatitis (HCC).    HPI/Events of Note   Hypotension, SBP 70-60s with MAP of mid 50s.   Earlier today, patient was hypertensive and received amlodipine and Coreg.  Has also been receiving pain medication oxycodone, Dilaudid.   Currently asymptomatic to BP.  Remains alert and oriented.   Orders placed for 1 L bolus.  Recheck BP after bolus and notify provider if SBP< 90 or MAP< 65.   RN was asked to hold off on further sedating medications.    Interventions/ Plan   Fluid bolus, 1 L Fentanyl, 12.5 mcg x 1 for pain.  Holding off on Dilaudid until BP stable.        Anthoney Harada, DNP, ACNPC- AG [facility]

## 2023-04-08 NOTE — Plan of Care (Signed)
  Problem: Education: Goal: Knowledge of General Education information will improve Description: Including pain rating scale, medication(s)/side effects and non-pharmacologic comfort measures Outcome: Progressing   Problem: Clinical Measurements: Goal: Ability to maintain clinical measurements within normal limits will improve Outcome: Progressing Goal: Will remain free from infection Outcome: Progressing   Problem: Pain Managment: Goal: General experience of comfort will improve Outcome: Progressing   Problem: Safety: Goal: Ability to remain free from injury will improve Outcome: Progressing   Problem: Skin Integrity: Goal: Risk for impaired skin integrity will decrease Outcome: Progressing

## 2023-04-08 NOTE — Progress Notes (Signed)
PROGRESS NOTE    Rachel [facility]  ZOX:[id number] DOB: 1961-02-16 DOA: 04/06/2023 PCP: [facility], [facility]  Subjective: Pt seen and examined. Oxycodone increased to 30 mg q6h. Had hypotension last night after HTN meds restarted. Pt states she does not take BP meds at home but knows she should.  Pt goes to [facility] for her oxycodone. Yet has not established with [facility] GI specialist at [facility] to care for her "chronic pancreatitis". After review of her labs, pt with only 1 instances of lipase of 102 on May 22, 2019. This was found in Care Everywhere.  All other lipases during her "pancreatitis" flares have all been NORMAL.  No CT abd going back as far as 03-2018 never shows any signs of chronic pancreatitis such as pancreatic calcifications, pancreatic atrophy, etc.  She has already had 6 CT abd/pelvis within [facility] in 2024 that all show normal pancreas and normal lipases despite pt's c/o of abd pain related to her pancreas.   [facility]s Course: HPI: Rachel [facility] is a 62 y.o. female with Pitts history significant for chronic pancreatitis with chronic pain syndrome, HTN, GERD who presented to [facility] ED for evaluation of abdominal pain.   Patient has a diagnosis of chronic pancreatitis with history of post ERCP pancreatitis in 2016.  She is on chronic pain regimen with oxycodone 15 mg every 6 hours as needed.   She states she had been feeling well until 3 days ago when she developed severe and persistent epigastric abdominal pain associated with nausea and vomiting.  She has not been able to maintain any adequate oral intake or take her medications.  She has been feeling dehydrated.  She states this feels similar to her previous pancreatitis flareups.  Due to her persistent symptoms she came to [facility] ED for further evaluation.  She denies any alcohol or tobacco use.    Significant Events: Admitted 04/06/2023   Significant Labs: 04-06-2023 Lipase normal at 31  Significant Imaging  Studies: CT abd negative for pancreatic inflammation. Prior cholecystectomy. No pancreatic calcifications MRCP from 10-12-2017 at [facility] shows pancreatic divisum  Antibiotic Therapy: Anti-infectives (From admission, onward)    None       Procedures:   Consultants:     Assessment and Plan: * Chronic generalized abdominal pain Admitted for Persistent uncontrolled epigastric pain likely related to acute on chronic pancreatitis.  CT A/P was unrevealing.  Has not been able to maintain adequate oral intake or keep her home medicines down.  I have extensively reviewed her CareEverywhere notes from Rachel [facility] in [place], Rachel [facility] in [place], [address]. Pt has never has CT or MRI abd that shows acute pancreatitis. When pt was in [facility], there was concern by outpatient pain [facility] about pt's validity for continuing opiate medications. That outpatient [place] pain [facility] declined to continue to prescribe pt's opiates. Pt has established herself with Rachel [facility] for her oxycodone but interestingly has not sought out GI assistance from [facility].  Pt states she has had celiac blocks in Pitts past that were ineffective.  There is mention in CareEverywhere about pt have hyperalgesia due to opiates. Pt unwilling to stop her oxycodone.  Pt mentions that she had her gallbladder removed in [facility] in 1990s. And that Pitts surgeon "messed" up her Rachel.  Unfortunately, those records are not available to review.  Will check fecal pancreatic elastase to workup for pancreatic insufficiency.  She was seen by GI specialist at [facility] in 05-2015 that doubted pt ever having chronic  pancreatitis. And if she did have pancreatitis, it was likely due to Pitts repeated ERCP she has had in Pitts past.  I am deleting her diagnosis of chronic pancreatitis from her Problem List and replacing it with chronic abd pain.  I believe she has drug seeking behavior for IV opiates.  IV dilaudid has been  decreased. Has also decreased her oxycodone to 20 mg q6h prn. Pt sees [facility] for her oxycodone.  Will change her to soft diet. Stop IVF. Plan for discharge in Pitts AM.   Condensed version: Is that GI specialist at [facility] did not think pt has chronic pancreatitis but rather chronic abd pain and hyperanalgesia from too much opiates.  Pt has only had one documented pancreatitis AFTER a ERCP procedure. No imaging has ever shown to her acute pancreatic inflammation during one of her "pancreatic flares".  Nor does she show any signs of chronic pancreatitis such as pancreatic calcifications or pancreatic atrophy.  ////////////////////////////////////////////////////////////////////////////////////////////////////////////////////////////////// From GI consult note in 2016 at [facility]: Read Rachel Pitts entire impression here:(from office visit 06-02-2015) Rachel Pitts is a very pleasant 62 year old woman who carries a diagnosis of chronic pancreatitis and has undergone extensive endotherapy for this condition and for pancreas divisum. She presents to [facility] with what she describes as progressively worsening recurrence of her pancreatic type abdominal pain.   I had an extensive discussion with Rachel [facility] in [place] regarding her abdominal pain syndrome. We discussed that ERCP has clearly not been a silver bullet in terms of durable response and is unlikely to represent something that will significantly impact her natural history in Pitts [place] run. We did discuss, however, that her improvement for approximately six years after [facility]' ERCP with recent recurrence of symptoms does suggest Pitts possibility of minor papilla restenosis or a pancreatic duct stricture and, on this basis, an ERCP could be considered, provided she understands Pitts limitations and risks of this procedure. We discussed that, in addition to ERCP, I believe it is important to perform an endoscopic ultrasound to determine Pitts presence  and severity of chronic pancreatitis, which may have implications in future management, including Pitts possibility of a total pancreatectomy with islet autotransplantation.   I performed an endoscopic ultrasound and ERCP on Ms. Jantz this week. During EUS, we identified some hyperechoic foci and non-honeycombed lobularity in Pitts pancreas. Her pancreatic duct was mildly irregular. Overall, these changes were not very prominent. Although they were indeterminate for a diagnosis of chronic pancreatitis by Rosemont criteria, they certainly did not represent a severe form of chronic pancreatitis that I expected on Pitts basis of her symptom complex. D  During ERCP, she did indeed appear to have restenosis of Pitts minor papilla. Her upstream pancreatic duct was slightly dilated with some irregularity and prominent side branches. We placed a 7 Fr stent across Pitts minor papilla into her bile duct, over which I performed a needle-knife minor papillotomy extension. This procedure was complicated by severe abdominal pain, and radiographically mild post-ERCP pancreatitis.   Overall, based on EUS and ERCP, I do believe that Ms. Weinberg probably does have some element of chronic pancreatitis, although I am not certain that this is Pitts primary driving process rather than a consequence of repeated ERCPs with prolonged pancreatic ductal stenting.   In either case, it seems unlikely that her debilitating and narcotic-requiring abdominal pain is solely Pitts result of pancreatic pathology. 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 Of note is that within 2 hours of my initial Pitts visit Mrs Treece called our office twice for a prescription.   Certainly, you may want to  consider these entities in your Fe[place]-term strategy for treating her abdominal pain moving forward if minor papilla stenting does not result in substantial improvement. I had discussed with Ms. Freundlich total pancreatectomy and islet autotransplantation, and please fee at liberty to refer Ms. Hollibaugh to Dr. Katina Degree at [facility] for consideration of this intervention, although based on Pitts appearance of her pancreas and Pitts characteristics of her pain, I wonder whether she will continue to have significant abdominal pain after such intervention.  Thank you very much for allowing me to participate in Pitts care of this very pleasant woman. Feel free to contact me via Meduline with any additional questions or concerns.  Sincerely,  B. Ponciano Ort, MD Associate Professor of Medicine [address], [facility] [address] [place], [place] [address]   Essential hypertension Does not appear to be taking antihypertensives currently as an outpatient. IV labetalol 10 mg as needed for SBP >170 Start coreg and norvasc.  Hypokalemia Replete with oral Kcl. Repeat BMP in AM.  Drug-seeking behavior - for IV dilaudid See overview for chronic abd pain  Chronic abdominal pain Condensed version: Is that GI specialist did not think pt has chronic pancreatitis but rather chronic abd pain and hypnoanalgesia from too much opiates.  Pt has only had documented pancreatitis AFTER a ERCP procedure. No imaging has ever shown to her acute pancreatic inflammation during one of her "pancreatic flares".  Nor does she show any signs of chronic pancreatitis such as pancreatic calcifications or pancreatic atrophy.     From GI consult note in 2016 at [facility]: Read Rachel Pitts entire impression here:(from office visit 06-02-2015) Ms. Helvi Longbottom is a very pleasant 62 year old woman who carries a diagnosis of chronic pancreatitis and has undergone extensive endotherapy for this condition and for pancreas divisum. She presents to [facility]  with what she describes as progressively worsening recurrence of her pancreatic type abdominal pain.   I had an extensive discussion with Ms. Mcclintic in [place] regarding her abdominal pain syndrome. We discussed that ERCP has clearly not been a silver bullet in terms of durable response and is unlikely to represent something that will significantly impact her natural history in Pitts [place] run. We did discuss, however, that her improvement for approximately six years after Rachel [facility]' ERCP with recent recurrence of symptoms does suggest Pitts possibility of minor papilla restenosis or a pancreatic duct stricture and, on this basis, an ERCP could be considered, provided she understands Pitts limitations and risks of this procedure. We discussed that, in addition to ERCP, I believe it is important to perform an endoscopic ultrasound to determine Pitts presence and severity of chronic pancreatitis, which may have implications in future management, including Pitts possibility of a total pancreatectomy with islet autotransplantation.   I performed an endoscopic ultrasound and ERCP on Ms. Roberg this week. During EUS, we identified some hyperechoic foci and non-honeycombed lobularity in Pitts pancreas. Her pancreatic duct was mildly irregular. Overall, these changes were not very prominent. Although they were indeterminate for a diagnosis of chronic pancreatitis by Rosemont criteria, they certainly did not represent a severe form of chronic pancreatitis that I expected on Pitts basis of  her symptom complex. D  During ERCP, she did indeed appear to have restenosis of Pitts minor papilla. Her upstream pancreatic duct was slightly dilated with some irregularity and prominent side branches. We placed a 7 Fr stent across Pitts minor papilla into her bile duct, over which I performed a needle-knife minor papillotomy extension. This procedure was complicated by severe abdominal pain, and radiographically mild post-ERCP pancreatitis.   Overall,  based on EUS and ERCP, I do believe that Ms. Velarde probably does have some element of chronic pancreatitis, although I am not certain that this is Pitts primary driving process rather than a consequence of repeated ERCPs with prolonged pancreatic ductal stenting.   In either case, it seems unlikely that her debilitating and narcotic-requiring abdominal pain is solely Pitts result of pancreatic pathology. 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 Of note is that within 2 hours of my initial Pitts visit Mrs Lanius called our office twice for a prescription.   Certainly, you may want to consider these entities in your [place]-term strategy for treating her abdominal pain moving forward if minor papilla stenting does not result in substantial improvement. I had discussed with Ms. Beedy total pancreatectomy and islet autotransplantation, and please fee at liberty to refer Ms. Strey to Dr. Katina Degree at [facility] for consideration of this intervention, although based on Pitts appearance of her pancreas and Pitts characteristics of her pain, I wonder whether she will continue to have significant abdominal pain after such intervention.  Thank you very much for allowing me to participate in Pitts care of this very pleasant woman. Feel free to contact me via Meduline with any additional questions or concerns.  Sincerely,  B. Ponciano Ort, MD Associate Professor of Medicine [address], [facility] [address] [place], [place] [address]       DVT prophylaxis: enoxaparin (LOVENOX) injection 40 mg Start: 04/06/23 2200    Code Status: Full Code Family Communication: no family at bedside Disposition Plan: return home Reason for continuing need for hospitalization:  advancing diet to soft diet. Plan for DC in AM.  Objective: Vitals:   04/07/23 2300 04/08/23 0143 04/08/23 0557 04/08/23 0955  BP: 100/60 134/73 111/77 (!) 148/84  Pulse:  76 86 85  Resp:  16 16 17   Temp:  98.4 F (36.9 C) 98.1 F (36.7 C) 98.7 F (37.1 C)  TempSrc:  Oral Oral Oral  SpO2:  100% 99% 96%  Weight:      Height:        Intake/Output Summary (Last 24 hours) at 04/08/2023 1320 Last data filed at 04/08/2023 0800 Gross per 24 hour  Intake 547.54 ml  Output --  Net 547.54 ml   Filed Weights   04/06/23 0819 04/06/23 1829  Weight: 49.9 kg 49.9 kg    Examination:  Physical Exam Vitals and nursing note reviewed.  Constitutional:      General: She is not in acute distress.    Appearance: She is not toxic-appearing.  HENT:     Head: Normocephalic and atraumatic.     Nose: Nose normal.  Cardiovascular:     Rate and Rhythm: Normal rate and regular rhythm.  Pulmonary:     Effort: Pulmonary effort is normal.     Breath sounds: Normal breath sounds.  Abdominal:     General: Bowel sounds are normal. There is no distension.     Palpations: Abdomen is soft.     Tenderness: There is generalized abdominal tenderness. There is no guarding or rebound.  Musculoskeletal:     Right lower leg: No edema.     Left lower leg: No edema.  Skin:    General: Skin is warm and dry.     Capillary Refill: Capillary refill takes less than 2 seconds.  Neurological:     General: No focal deficit present.     Mental Status: She is alert and oriented to person, place, and time.  Psychiatric:        Mood and Affect: Mood is anxious.     Data Reviewed: I have personally reviewed following labs and imaging studies  CBC: Recent Labs  Lab 04/06/23 0958 04/07/23 0601  WBC 11.3* 7.6  NEUTROABS 8.2*  --   HGB 15.5* 11.6*  HCT 44.2 34.9*  MCV 86.3 92.8  PLT 274 221   Basic Metabolic Panel: Recent Labs  Lab 04/06/23 0958 04/07/23 0601  NA 141 139  K 3.5 3.2*  CL 107 106  CO2  17* 23  GLUCOSE 102* 101*  BUN 15 12  CREATININE 0.83 0.93  CALCIUM 9.9 8.2*   GFR: Estimated Creatinine Clearance: 50 mL/min (by C-G formula based on SCr of 0.93 mg/dL). Liver Function Tests: Recent Labs  Lab 04/06/23 0958  AST 20  ALT 16  ALKPHOS 97  BILITOT 0.7  PROT 8.9*  ALBUMIN 4.8   Recent Labs  Lab 04/06/23 0958 04/07/23 0601  LIPASE 31 31    Radiology Studies: No results found.  Scheduled Meds:  amLODipine  10 mg Oral Daily   Chlorhexidine Gluconate Cloth  6 each Topical Daily   enoxaparin (LOVENOX) injection  40 mg Subcutaneous Q24H   pantoprazole  40 mg Oral Daily   sodium chloride flush  10-40 mL Intracatheter Q12H   zolpidem  5 mg Oral QHS   Continuous Infusions:  promethazine (PHENERGAN) injection (IM or IVPB) Stopped (04/06/23 1056)     LOS: 1 day   Time spent: 40 minutes  Carollee Herter, DO  [facility]  04/08/2023, 1:20 PM

## 2023-04-09 DIAGNOSIS — R109 Unspecified abdominal pain: Secondary | ICD-10-CM | POA: Diagnosis not present

## 2023-04-09 DIAGNOSIS — E876 Hypokalemia: Secondary | ICD-10-CM | POA: Diagnosis not present

## 2023-04-09 DIAGNOSIS — R1084 Generalized abdominal pain: Secondary | ICD-10-CM | POA: Diagnosis not present

## 2023-04-09 DIAGNOSIS — I10 Essential (primary) hypertension: Secondary | ICD-10-CM | POA: Diagnosis not present

## 2023-04-09 LAB — COMPREHENSIVE METABOLIC PANEL
ALT: 18 U/L (ref 0–44)
AST: 18 U/L (ref 15–41)
Albumin: 3.4 g/dL — ABNORMAL LOW (ref 3.5–5.0)
Alkaline Phosphatase: 64 U/L (ref 38–126)
Anion gap: 6 (ref 5–15)
BUN: 21 mg/dL (ref 8–23)
CO2: 24 mmol/L (ref 22–32)
Calcium: 8.9 mg/dL (ref 8.9–10.3)
Chloride: 109 mmol/L (ref 98–111)
Creatinine, Ser: 0.84 mg/dL (ref 0.44–1.00)
GFR, Estimated: >60 mL/min (ref >60)
Glucose, Bld: 92 mg/dL (ref 70–99)
Potassium: 3.9 mmol/L (ref 3.5–5.1)
Sodium: 139 mmol/L (ref 135–145)
Total Bilirubin: 0.5 mg/dL (ref 0.3–1.2)
Total Protein: 6.5 g/dL (ref 6.5–8.1)

## 2023-04-09 LAB — LIPASE, BLOOD: Lipase: 27 U/L (ref 11–51)

## 2023-04-09 MED ORDER — AMLODIPINE BESYLATE 10 MG PO TABS: 10.0000 mg | ORAL_TABLET | Freq: Every day | ORAL | 0 refills | Status: AC

## 2023-04-09 MED ORDER — HEPARIN SOD (PORK) LOCK FLUSH 100 UNIT/ML IV SOLN
500.0000 [IU] | INTRAVENOUS | Status: AC | PRN
  Administered 2023-04-09: 500 [IU]
  Filled 2023-04-09: qty 5

## 2023-04-09 NOTE — Discharge Summary (Signed)
[facility] Phys[facility] Discharge Summary   Patient name: Rachel Pitts  Admit date:     04/06/2023  Discharge date: 04/09/2023  Attending Physician: Imogene Burn, Sair Faulcon [[id number]]  Discharge Physician: Carollee Herter   PCP: [facility]  Admitted From: Home  Disposition:  Home  Recommendations for Outpatient Follow-up:  Follow up with PCP in 1-2 weeks Please follow up on the following pending results: Fecal Pancreatic elastase(workup for pancreatic insufficiency)  Home Health:No Equipment/Devices: None  Discharge Condition:Stable CODE STATUS:FULL Diet recommendation: Heart Healthy Fluid Restriction: None  Hospital Summary: HPI: Rachel Pitts is a 62 y.o. female with medical history significant for chronic pancreatitis with chronic pain syndrome, HTN, GERD who presented to the ED for evaluation of abdominal pain.   Patient has a diagnosis of chronic pancreatitis with history of post ERCP pancreatitis in 2016.  She is on chronic pain regimen with oxycodone 15 mg every 6 hours as needed.   She states she had been feeling well until 3 days ago when she developed severe and persistent epigastric abdominal pain associated with nausea and vomiting.  She has not been able to maintain any adequate oral intake or take her medications.  She has been feeling dehydrated.  She states this feels similar to her previous pancreatitis flareups.  Due to her persistent symptoms she came to the ED for further evaluation.  She denies any alcohol or tobacco use.    Significant Events: Admitted 04/06/2023   Significant Labs: 04-06-2023 Lipase normal at 31 04-07-2023 lipase normal at 31 04-08-2023 lipase normal at 27 Fecal pancreatic elastase PENDING(workup for pancreatic insufficiency)  Significant Imaging Studies: CT abd negative for pancreatic inflammation. Prior cholecystectomy. No pancreatic calcifications MRCP from 10-12-2017 at [facility] shows pancreatic divisum  Antibiotic  Therapy: Anti-infectives (From admission, onward)    None       Procedures:   Consultants:    Hospital Course by Problem: * Chronic generalized abdominal pain Admitted for Persistent uncontrolled epigastric pain likely related to acute on chronic pancreatitis.  CT A/P was unrevealing.  Has not been able to maintain adequate oral intake or keep her home medicines down.  I have extensively reviewed her CareEverywhere notes from [facility] in [place], [facility] in [place], [address]. Pt has never has CT or MRI abd that shows acute pancreatitis. When pt was in [place], there was concern by outpatient pain clinic about pt's validity for continuing opiate medications. That outpatient [place] pain clinic declined to continue to prescribe pt's opiates. Pt has established herself with [facility] for her oxycodone but interestingly has not sought out GI assistance from [facility].  Pt states she has had celiac blocks in the past that were ineffective.  There is mention in CareEverywhere about pt have hyperalgesia due to opiates. Pt unwilling to stop her oxycodone.  Pt mentions that she had her gallbladder removed in [place] in 1990s. And that the surgeon "messed" up her surgery.  Unfortunately, those records are not available to review.  Will check fecal pancreatic elastase to workup for pancreatic insufficiency.  She was seen by GI specialist at [facility] in 05-2015 that doubted pt ever having chronic pancreatitis. And if she did have pancreatitis, it was likely due to the repeated ERCP she has had in the past.  I am deleting her diagnosis of chronic pancreatitis from her Problem List and replacing it with chronic abd pain.  I believe she has drug seeking behavior for IV opiates.  IV dilaudid was initially prescribed. This dose  was reduced and subsequently stopped and pt has no indication for IV opiates.  Her oxycodone was temporarily increased during her hospital stay  to 20 mg q6h. Pt has had oxycodone 15 mg #120 filled on 03-26-2023 for 30 day supply.  Pt sees [facility] for her oxycodone.  Pt tolerated soft diet. Plan for Discharge today.  Please note that pt's lipase was normal her entire stay in the hospital. And 6 CT abd/pelvis scans in 2024(3-20, 4-22, 5-15-, 6-19, 9-12) all show normal pancreatic contours. No inflammation. No evidence of pancreatic calcifications or pancreatic atrophy.  Each of these CT scan were performed in the ER during her complaints of abd pain/pancreatitis flare.  ////////////////////////////////////////////////////////////////////////////////////////////////////////////////////////////////// Condensed version: Is that GI specialist at [facility] did not think pt has chronic pancreatitis but rather chronic abd pain and hyperanalgesia from too much opiates.  Pt has only had one documented pancreatitis AFTER a ERCP procedure. No imaging has ever shown to her acute pancreatic inflammation during one of her "pancreatic flares".  Nor does she show any signs of chronic pancreatitis such as pancreatic calcifications or pancreatic atrophy.  ////////////////////////////////////////////////////////////////////////////////////////////////////////////////////////////////// From GI consult note in 2016 at [facility]: Read Dr. Audelia Hives entire impression here:(from office visit 06-02-2015) [name] is a very pleasant 62 year old woman who carries a diagnosis of chronic pancreatitis and has undergone extensive endotherapy for this condition and for pancreas divisum. She presents to clinic with what she describes as progressively worsening recurrence of her pancreatic type abdominal pain.   I had an extensive discussion with Ms. Blakeney in clinic regarding her abdominal pain syndrome. We discussed that ERCP has clearly not been a silver bullet in terms of durable response and is unlikely to represent something that will significantly impact her natural  history in the [place] run. We did discuss, however, that her improvement for approximately six years after Dr. Orlene Erm' ERCP with recent recurrence of symptoms does suggest the possibility of minor papilla restenosis or a pancreatic duct stricture and, on this basis, an ERCP could be considered, provided she understands the limitations and risks of this procedure. We discussed that, in addition to ERCP, I believe it is important to perform an endoscopic ultrasound to determine the presence and severity of chronic pancreatitis, which may have implications in future management, including the possibility of a total pancreatectomy with islet autotransplantation.   I performed an endoscopic ultrasound and ERCP on Ms. Droege this week. During EUS, we identified some hyperechoic foci and non-honeycombed lobularity in the pancreas. Her pancreatic duct was mildly irregular. Overall, these changes were not very prominent. Although they were indeterminate for a diagnosis of chronic pancreatitis by [place] criteria, they certainly did not represent a severe form of chronic pancreatitis that I expected on the basis of her symptom complex. 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 Her upstream pancreatic duct was slightly dilated with some irregularity and prominent side branches. We placed a 7 Fr stent across the minor papilla into her bile duct, over which I performed a needle-knife minor papillotomy extension. This procedure was complicated by severe abdominal pain, and radiographically mild post-ERCP pancreatitis.   Overall, based on EUS and ERCP, I do believe that Ms. Sampath probably does have some element of chronic pancreatitis, although I am not certain that this is the primary driving process rather than a consequence of repeated ERCPs with prolonged pancreatic ductal stenting.   In either case, it seems unlikely that her debilitating and narcotic-requiring  abdominal pain is solely  the result of pancreatic pathology. 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 Of note is that within 2 hours of my initial clinic visit Mrs Holzschuh called our office twice for a prescription.   Certainly, you may want to consider these entities in your [place]-term strategy for treating her abdominal pain moving forward if minor papilla stenting does not result in substantial improvement. I had discussed with Ms. Meader total pancreatectomy and islet autotransplantation, and please fee at liberty to refer Ms. Lerner to Dr. Katina Degree at [facility] for consideration of this intervention, although based on the appearance of her pancreas and the characteristics of her pain, I wonder whether she will continue to have significant abdominal pain after such intervention.  Thank you very much for allowing me to participate in the care of this very pleasant woman. Feel free to contact me via Meduline with any additional questions or concerns.  Sincerely,  B. Ponciano Ort, MD Associate Professor of Medicine [address], [facility] [address] [place], [place] [address]   Essential hypertension Does not appear to be taking antihypertensives currently as an outpatient. BP elevated during hospitalization Will discharge on norvasc 10 mg daily. Followup with PCP.  Hypokalemia Resolved after Repletion with oral Kcl  Drug-seeking behavior - for IV dilaudid See overview for chronic abd pain  Chronic abdominal pain Condensed version: Is that GI specialist did not think pt has chronic pancreatitis but rather chronic abd pain and hypnoanalgesia from too much opiates.  Pt has only had documented pancreatitis AFTER a ERCP procedure. No imaging has ever shown to her  acute pancreatic inflammation during one of her "pancreatic flares".  Nor does she show any signs of chronic pancreatitis such as pancreatic calcifications or pancreatic atrophy.     From GI consult note in 2016 at [facility]: Read Dr. Audelia Hives entire impression here:(from office visit 06-02-2015) Ms. Latronda Mariner is a very pleasant 62 year old woman who carries a diagnosis of chronic pancreatitis and has undergone extensive endotherapy for this condition and for pancreas divisum. She presents to clinic with what she describes as progressively worsening recurrence of her pancreatic type abdominal pain.   I had an extensive discussion with Ms. Turkington in clinic regarding her abdominal pain syndrome. We discussed that ERCP has clearly not been a silver bullet in terms of durable response and is unlikely to represent something that will significantly impact her natural history in the [place] run. We did discuss, however, that her improvement for approximately six years after Dr. Orlene Erm' ERCP with recent recurrence of symptoms does suggest the possibility of minor papilla restenosis or a pancreatic duct stricture and, on this basis, an ERCP could be considered, provided she understands the limitations and risks of this procedure. We discussed that, in addition to ERCP, I believe it is important to perform an endoscopic ultrasound to determine the presence and severity of chronic pancreatitis, which may have implications in future management, including the possibility of a total pancreatectomy with islet autotransplantation.   I performed an endoscopic ultrasound and ERCP on Ms. Lazo this week. During EUS, we identified some hyperechoic foci and non-honeycombed lobularity in the pancreas. Her pancreatic duct was mildly irregular. Overall, these changes were not very prominent. Although they were indeterminate for a  diagnosis of chronic pancreatitis by [place] criteria, they certainly did not represent a severe form of  chronic pancreatitis that I expected on the basis of her symptom complex. 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 Her upstream pancreatic duct was slightly dilated with some irregularity and prominent side branches. We placed a 7 Fr stent across the minor papilla into her bile duct, over which I performed a needle-knife minor papillotomy extension. This procedure was complicated by severe abdominal pain, and radiographically mild post-ERCP pancreatitis.   Overall, based on EUS and ERCP, I do believe that Ms. Zahner probably does have some element of chronic pancreatitis, although I am not certain that this is the primary driving process rather than a consequence of repeated ERCPs with prolonged pancreatic ductal stenting.   In either case, it seems unlikely that her debilitating and narcotic-requiring abdominal pain is solely the result of pancreatic pathology. 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 Of note is that within 2 hours of my initial clinic visit Mrs Huhta called our office twice for a prescription.   Certainly, you may want to consider these entities in your [place]-term strategy for treating her abdominal pain moving forward if minor papilla stenting does not result in substantial improvement. I had discussed with Ms. Terrones total pancreatectomy and islet autotransplantation, and please fee at liberty to refer Ms. Protzman to Dr. Katina Degree at [facility] for consideration of this intervention, although based on the appearance of her pancreas and the characteristics of her pain, I wonder whether she will continue to have significant abdominal pain after such intervention.  Thank you very much for allowing me  to participate in the care of this very pleasant woman. Feel free to contact me via Meduline with any additional questions or concerns.  Sincerely,  B. Ponciano Ort, MD Associate Professor of Medicine [address], [address], [facility] [address] [place], [place] [address]    Discharge Diagnoses:  Principal Problem:   Chronic generalized abdominal pain Active Problems:   Hypokalemia   Essential hypertension   Chronic abdominal pain   Drug-seeking behavior - for IV dilaudid   Discharge Instructions  Discharge Instructions     Call MD for:  difficulty breathing, headache or visual disturbances   Complete by: As directed    Call MD for:  extreme fatigue   Complete by: As directed    Call MD for:  persistant dizziness or light-headedness   Complete by: As directed    Call MD for:  persistant nausea and vomiting   Complete by: As directed    Call MD for:  redness, tenderness, or signs of infection (pain, swelling, redness, odor or green/yellow discharge around incision site)   Complete by: As directed    Call MD for:  temperature >100.4   Complete by: As directed    Diet - low sodium heart healthy   Complete by: As directed    Discharge instructions   Complete by: As directed    1. Followup with [facility] for you pain and primary care needs. Please followup with them within 2 weeks following discharge from hospital. 2. Establish care with the Gastroenterologist specialist at [facility]. Your PCP at [facility] can place  the referral.   Increase activity slowly   Complete by: As directed       Allergies as of 04/09/2023       Reactions   Aspirin Anaphylaxis   Iodine Shortness Of Breath   Ivp Dye [iodinated Contrast Media] Shortness Of Breath   Ketorolac Nausea And Vomiting   Morphine And Codeine Nausea And Vomiting   Tramadol Nausea And Vomiting        Medication List     TAKE these medications    amLODipine 10 MG tablet Commonly  known as: NORVASC Take 1 tablet (10 mg total) by mouth daily.   Eszopiclone 3 MG Tabs Take 3 mg by mouth at bedtime.   omeprazole 20 MG capsule Commonly known as: PRILOSEC Take 20 mg by mouth daily.   ondansetron 4 MG tablet Commonly known as: ZOFRAN Take 1 tablet (4 mg total) by mouth every 4 (four) hours as needed for nausea or vomiting.   oxyCODONE 15 MG immediate release tablet Commonly known as: ROXICODONE Take 1 tablet (15 mg total) by mouth every 6 (six) hours as needed for pain.   promethazine 25 MG tablet Commonly known as: PHENERGAN Take 1 tablet (25 mg total) by mouth every 6 (six) hours as needed for nausea or vomiting.   sucralfate 1 g tablet Commonly known as: Carafate Take 1 tablet (1 g total) by mouth with breakfast, with lunch, and with evening meal for 7 days.   tiZANidine 4 MG tablet Commonly known as: ZANAFLEX Take 4 mg by mouth 3 (three) times daily.   Vitamin D (Ergocalciferol) 1.25 MG (50000 UNIT) Caps capsule Commonly known as: DRISDOL Take 50,000 Units by mouth once a week.        Allergies  Allergen Reactions   Aspirin Anaphylaxis   Iodine Shortness Of Breath   Ivp Dye [Iodinated Contrast Media] Shortness Of Breath   Ketorolac Nausea And Vomiting   Morphine And Codeine Nausea And Vomiting   Tramadol Nausea And Vomiting    Discharge Exam: Vitals:   04/08/23 1933 04/09/23 0555  BP: 99/64 123/70  Pulse: 69 83  Resp: 18 17  Temp: 98.2 F (36.8 C) 98.6 F (37 C)  SpO2: 99% 98%    Physical Exam Vitals and nursing note reviewed.  Constitutional:      General: She is not in acute distress.    Appearance: Normal appearance. She is not ill-appearing, toxic-appearing or diaphoretic.  HENT:     Head: Normocephalic and atraumatic.  Eyes:     General: No scleral icterus. Cardiovascular:     Rate and Rhythm: Normal rate and regular rhythm.     Pulses: Normal pulses.  Pulmonary:     Effort: Pulmonary effort is normal. No respiratory  distress.  Abdominal:     General: Abdomen is flat. There is no distension.  Musculoskeletal:     Right lower leg: No edema.     Left lower leg: No edema.  Skin:    General: Skin is warm and dry.     Capillary Refill: Capillary refill takes less than 2 seconds.  Neurological:     General: No focal deficit present.     Mental Status: She is alert and oriented to person, place, and time.     The results of significant diagnostics from this hospitalization (including imaging, microbiology, ancillary and laboratory) are listed below for reference.    Microbiology: No results found for this or any previous visit (from the past 240 hour(s)).  Labs: Basic Metabolic Panel: Recent Labs  Lab 04/06/23 0958 04/07/23 0601 04/09/23 0500  NA 141 139 139  K 3.5 3.2* 3.9  CL 107 106 109  CO2 17* 23 24  GLUCOSE 102* 101* 92  BUN 15 12 21   CREATININE 0.83 0.93 0.84  CALCIUM 9.9 8.2* 8.9   Liver Function Tests: Recent Labs  Lab 04/06/23 0958 04/09/23 0500  AST 20 18  ALT 16 18  ALKPHOS 97 64  BILITOT 0.7 0.5  PROT 8.9* 6.5  ALBUMIN 4.8 3.4*   Recent Labs  Lab 04/06/23 0958 04/07/23 0601 04/09/23 0500  LIPASE 31 31 27    CBC: Recent Labs  Lab 04/06/23 0958 04/07/23 0601  WBC 11.3* 7.6  NEUTROABS 8.2*  --   HGB 15.5* 11.6*  HCT 44.2 34.9*  MCV 86.3 92.8  PLT 274 221   Urinalysis    Component Value Date/Time   COLORURINE STRAW (A) 01/11/2023 1254   APPEARANCEUR CLEAR 01/11/2023 1254   LABSPEC 1.010 01/11/2023 1254   PHURINE 6.0 01/11/2023 1254   GLUCOSEU NEGATIVE 01/11/2023 1254   HGBUR TRACE (A) 01/11/2023 1254   BILIRUBINUR NEGATIVE 01/11/2023 1254   KETONESUR NEGATIVE 01/11/2023 1254   PROTEINUR NEGATIVE 01/11/2023 1254   NITRITE NEGATIVE 01/11/2023 1254   LEUKOCYTESUR NEGATIVE 01/11/2023 1254   Sepsis Labs Recent Labs  Lab 04/06/23 0958 04/07/23 0601  WBC 11.3* 7.6   Procedures/Studies: CT ABDOMEN PELVIS WO CONTRAST  Result Date:  04/06/2023 CLINICAL DATA:  Abdominal pain, acute, nonlocalized LUQ abdominal pain for several days. Pt states she has chronic pancreatitis and feels the same as previous attacks. Pt not able to keep anything down due to pain and N/V/D. No known fevers. EXAM: CT ABDOMEN AND PELVIS WITHOUT CONTRAST TECHNIQUE: Multidetector CT imaging of the abdomen and pelvis was performed following the standard protocol without IV contrast. RADIATION DOSE REDUCTION: This exam was performed according to the departmental dose-optimization program which includes automated exposure control, adjustment of the mA and/or kV according to patient size and/or use of iterative reconstruction technique. COMPARISON:  Abdomen pelvis 01/11/2023 FINDINGS: Lower chest: Elevated left hemidiaphragm.  No acute abnormality. Hepatobiliary: No focal liver abnormality. Status post cholecystectomy. No biliary dilatation. Pancreas: No focal lesion. Normal pancreatic contour. No surrounding inflammatory changes. No main pancreatic ductal dilatation. Spleen: Normal in size without focal abnormality. Splenule noted eight Adrenals/Urinary Tract: No adrenal nodule bilaterally. No nephrolithiasis and no hydronephrosis. No definite contour-deforming renal mass. No ureterolithiasis or hydroureter. The urinary bladder is unremarkable. Stomach/Bowel: Stomach is within normal limits. No evidence of bowel wall thickening or dilatation. Appendix appears normal. Vascular/Lymphatic: No abdominal aorta or iliac aneurysm. Mild atherosclerotic plaque of the aorta and its branches. No abdominal, pelvic, or inguinal lymphadenopathy. Reproductive: Status post hysterectomy. No adnexal masses. Other: No intraperitoneal free fluid. No intraperitoneal free gas. No organized fluid collection. Musculoskeletal: Tiny umbilical hernia. No suspicious lytic or blastic osseous lesions. No acute displaced fracture. IMPRESSION: 1. No acute intra-abdominal intrapelvic abnormality with limited  evaluation on this noncontrast study. 2. Aortic Atherosclerosis (ICD10-I70.0). Electronically Signed   By: Tish Frederickson M.D.   On: 04/06/2023 10:54    Time coordinating discharge: 40 mins  SIGNED:  Carollee Herter, DO [facility] 04/09/23, 8:49 AM

## 2023-04-09 NOTE — Plan of Care (Signed)
Problem: Education: Goal: Knowledge of General Education information will improve Description: Including pain rating scale, medication(s)/side effects and non-pharmacologic comfort measures Outcome: Progressing   Problem: Health Behavior/Discharge Planning: Goal: Ability to manage health-related needs will improve Outcome: Progressing   Problem: Pain Managment: Goal: General experience of comfort will improve Outcome: Progressing

## 2023-04-11 LAB — PANCREATIC ELASTASE, FECAL: Pancreatic Elastase-1, Stool: 567 ug Elast./g (ref >200)

## 2024-07-14 NOTE — H&P (Signed)
 [facility] PCP:  Code status: Full code  History and Physical   Assessment   Active Hospital Problems *Acute on chronic pancreatitis (*) - N.p.o., aggressive IV fluid hydration - IV Dilaudid  1 to 2 mg every 3 hours as needed moderate to severe pain.  Patient's pain unremitting despite 4 mg IV Dilaudid  every 4 hours.  Hypomagnesemia // Hypokalemia - Replacing IV.  Follow-up labs in the a.m.  Plan   Fluid and electrolyte disorder: Dehydration, Hypomagnesemia, and Hypokalemia, present on admission  Plan: Hydrate with IVF, Repeat BMP in AM, Replete with potassium, and Repeat Magnesium  in AM  History   History of Present Illness  This is a 63 year old female with past medical history of chronic pancreatitis, HTN.  3 days ago she developed nausea, vomiting, diarrhea [by diarrhea].  She states she is unable to keep even water down.  She endorses severe abdominal pain greater on the right side radiating to the back.  She states she is weak and lightheaded.  She endorses chronic shortness of breath but states is worse now.  She has a history of chronic pancreatitis.  She came to the ER  Significant presenting vitals: Within acceptable range Labs: K3.4, WBCs 18.8, magnesium  1.5.  Lipase normal.   Urinalysis normal.  CXR: Normal CT abdomen pelvis: No acute abnormality  From GI consult note in 2016 at [facility]: Rachel Pitts is a very pleasant 63 year old woman who carries a diagnosis of chronic pancreatitis and has undergone extensive endotherapy for this condition and for pancreas divisum. She presents to clinic with what she describes as progressively worsening recurrence of her pancreatic type abdominal pain.    I had an extensive discussion with [name] in clinic regarding her abdominal pain syndrome. We discussed that ERCP has clearly not been a silver bullet in terms of durable response and is unlikely to represent something that will significantly impact  her natural history in the [place] run. We did discuss, however, that her improvement for approximately six years after Dr. Elona' ERCP with recent recurrence of symptoms does suggest the possibility of minor papilla restenosis or a pancreatic duct stricture and, on this basis, an ERCP could be considered, provided she understands the limitations and risks of this procedure. We discussed that, in addition to ERCP, I believe it is important to perform an endoscopic ultrasound to determine the presence and severity of chronic pancreatitis, which may have implications in future management, including the possibility of a total pancreatectomy with islet autotransplantation.    I performed an endoscopic ultrasound and ERCP on Rachel Pitts this week. During EUS, we identified some hyperechoic foci and non-honeycombed lobularity in the pancreas. Her pancreatic duct was mildly irregular. Overall, these changes were not very prominent. Although they were indeterminate for a diagnosis of chronic pancreatitis by [place] criteria, they certainly did not represent a severe form of chronic pancreatitis that I expected on the basis of her symptom complex. 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 Her upstream pancreatic duct was slightly dilated with some irregularity and prominent side branches. We placed a 7 Fr stent across the minor papilla into her bile duct, over which I performed a needle-knife minor papillotomy extension. This procedure was complicated by severe abdominal pain, and radiographically mild post-ERCP pancreatitis.    Overall, based on EUS and ERCP, I do believe that Ms. Lezama probably does have some element of chronic pancreatitis, although I am not certain  that this is the primary driving process rather than a consequence of repeated ERCPs with prolonged pancreatic ductal stenting.    In either case, it seems unlikely that her debilitating and narcotic-requiring abdominal  pain is solely the result of pancreatic pathology. 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 Of note is that within 2 hours of my initial clinic visit Rachel Pitts called our office twice for a prescription.    Certainly, you may want to consider these entities in your [place]-term strategy for treating her abdominal pain moving forward if minor papilla stenting does not result in substantial improvement. I had discussed with Rachel Pitts total pancreatectomy and islet autotransplantation, and please fee at liberty to refer Rachel Pitts to Dr. Izetta Search at [facility] for consideration of this intervention, although based on the appearance of her pancreas and the characteristics of her pain, I  Past Medical History:  has a past medical history of Cancer (*), Chronic pain syndrome, Chronic pancreatitis (*), History of transfusion, Hypertension, Pancreatic disorder (*), Pancreatic divisum, Port-A-Cath in place, and Wears dentures. Past Surgical History:  has a past surgical history that includes Cholecystectomy (07/25/1989); Hysterectomy (07/25/2006); Esophagogastroduodenoscopy (N/A, 06/26/2012); Colonoscopy (N/A, 06/26/2012); and Colposcopy (09/27/2023).  Allergies: is allergic to aspirin, iodine, ivp [iodinated diagnostic agents], ketorolac , ambien , morphine  and related, and tramadol. Prior to Admission medications  Medication Sig Start Date End Date Taking? Authorizing Provider  amlodipine -benazepril (LOTREL) 10-20 MG per capsule Take one capsule by mouth daily. 06/28/24   Historical Provider, MD  clobetasol (TEMOVATE) 0.05 % ointment Apply a thin layer twice daily for the next 4 weeks, then taper to a thin layer nightly for 4 weeks, then decrease to every other  night x 4 weeks, then use indefinitely just twice a week at bedtime. Patient not taking: Reported on 06/17/2024 10/13/23   Meaghan M Aalto, MD  estradiol (ESTRACE) 0.1 mg/gram vaginal cream PLACE 1 GRAM VAGINALLY TWICE A WEEK FOR MAINTENANCE 07/01/24   Meaghan M Aalto, MD  eszopiclone (LUNESTA) 3 mg tablet Take three mg by mouth at bedtime. 09/25/23   Historical Provider, MD  metoprolol succinate (TOPROL-XL) 50 mg 24 hr tablet Take one tablet (50 mg dose) by mouth daily. 04/01/24   Historical Provider, MD  naloxone [facility]) nasal spray (TAKE HOME PACK) one spray by Intranasal route as needed for Opioid Reversal. 01/25/24   Rupert DELENA Cera, MD  omeprazole (PRILOSEC) 20 MG capsule Take one capsule (20 mg dose) by mouth daily.    Historical Provider, MD  oxyCODONE  HCl (ROXICODONE ) 15 mg immediate release tablet Take one tablet (15 mg dose) by mouth every 6 (six) hours as needed. 05/30/24   Historical Provider, MD  promethazine  (PHENERGAN ) 25 MG tablet Take one tablet (25 mg dose) by mouth 3 (three) times a day.    Historical Provider, MD  sucralfate  (CARAFATE ) 1 g tablet Take one tablet (1 g dose) by mouth 3 (three) times a day for 30 days. 02/17/24 03/18/24  Ludie Primmer, DO  tiZANidine (ZANAFLEX) 4 mg tablet Take one tablet (4 mg dose) by mouth 3 (three) times a day as needed. 09/25/23   Historical Provider, MD   Social History:  reports that she has never smoked. She has never been exposed to tobacco smoke. She has never used  smokeless tobacco. She reports that she does not drink alcohol and does not use drugs. Family History: family history includes Leukemia (age of onset: 7) in her father; No Known Problems in her brother, maternal grandfather, maternal grandmother, paternal grandfather, paternal grandmother, sister, and another family member. Review of Systems  Gastrointestinal:  Positive for abdominal pain, nausea and vomiting.  All other systems reviewed and are negative.     Physical Examination   Temp:   [98.1 F (36.7 C)] 98.1 F (36.7 C) Heart Rate:  [119] 119 Resp:  [19] 19 BP: (140)/(82) 140/82 SpO2:  [99 %] 99 % Physical Exam Vitals reviewed.  Constitutional:      General: She is in acute distress.     Appearance: Normal appearance. She is normal weight. She is not ill-appearing.  HENT:     Head: Atraumatic.     Nose: No congestion.     Mouth/Throat:     Mouth: Mucous membranes are dry.  Eyes:     General: No scleral icterus. Cardiovascular:     Rate and Rhythm: Normal rate and regular rhythm.     Pulses: Normal pulses.     Heart sounds: Normal heart sounds. No murmur heard. Musculoskeletal:        General: Normal range of motion.     Right lower leg: No edema.     Left lower leg: No edema.  Pulmonary:     Effort: Pulmonary effort is normal. No respiratory distress.     Breath sounds: Normal breath sounds. No wheezing.  Abdominal:     General: Abdomen is flat. There is no distension.     Palpations: Abdomen is soft.     Tenderness: There is abdominal tenderness. There is guarding.  Skin:    General: Skin is warm.     Capillary Refill: Capillary refill takes 2 to 3 seconds.     Coloration: Skin is not jaundiced.     Findings: No bruising.  Neurological:     General: No focal deficit present.     Mental Status: She is alert and oriented to person, place, and time.     Cranial Nerves: No cranial nerve deficit.     Motor: No weakness.  General: Tearful patient Abdomen: Positive TTP RUQ and epigastric  Results     Labs:  Recent Results (from the past 24 hours)  CBC And Differential   Collection Time: 07/14/24  6:23 PM  Result Value Ref Range   WBC 18.8 (H) 4.0 - 10.5 thou/mcL   RBC 4.37 3.93 - 5.22 million/mcL   HGB 13.6 11.2 - 15.7 gm/dL   HCT 61.4 65.8 - 55.0 %   MCV 88.1 79.4 - 94.8 fL   MCH 31.1 25.6 - 32.2 pg   MCHC 35.3 32.2 - 35.5 gm/dL   Plt Ct 722 849 - 599 thou/mcL   RDW SD 38.4 35.1 - 46.3 fL   MPV 10.2 9.4 - 12.4 fL   NRBC% 0.0 0.0 - 0.2  /100WBC   Absolute NRBC Count 0.00 0.00 - 0.01 thou/mcL   NEUTROPHIL % 83.0 %   LYMPHOCYTE % 12.0 %   MONOCYTE % 4.1 %   Eosinophil % 0.0 %   BASOPHIL % 0.3 %   IG% 0.6 %   ABSOLUTE NEUTROPHIL COUNT 15.60 (H) 1.50 - 7.50 thou/mcL   ABSOLUTE LYMPHOCYTE COUNT 2.26 1.00 - 4.50 thou/mcL   Absolute Monocyte Count 0.78 0.10 - 0.80 thou/mcL   Absolute Eosinophil Count 0.00 0.00 - 0.50 thou/mcL  Absolute Basophil Count 0.05 0.00 - 0.20 thou/mcL   Absolute Immature Granulocyte Count 0.12 (H) 0.00 - 0.03 thou/mcL   Comprehensive metabolic panel   Collection Time: 07/14/24  6:23 PM  Result Value Ref Range   Na 143 136 - 146 mmol/L   Potassium 3.4 (L) 3.7 - 5.4 mmol/L   Cl 102 97 - 108 mmol/L   CO2 20 20 - 32 mmol/L   AGAP 21 (H) 7 - 16 mmol/L   Glucose 146 (H) 65 - 99 mg/dL   BUN 16 8 - 27 mg/dL   Creatinine 8.89 (H) 9.42 - 1.00 mg/dL   Ca 9.7 8.6 - 89.7 mg/dL   ALK PHOS 890 25 - 834 U/L   T Bili 0.4 0.0 - 1.2 mg/dL   Total Protein 8.8 (H) 6.0 - 8.5 gm/dL   Alb 4.8 3.6 - 4.8 gm/dL   GLOBULIN 4.0 1.5 - 4.5 gm/dL   ALBUMIN/GLOBULIN RATIO 1.2 1.1 - 2.5   BUN/CREAT RATIO 14.5 11.0 - 26.0   ALT 22 0 - 40 U/L   AST 19 0 - 40 U/L   eGFR 57 (L) >=60 mL/min/1.79m2  Lipase   Collection Time: 07/14/24  6:23 PM  Result Value Ref Range   Lipase 54 0 - 59 U/L  Magnesium    Collection Time: 07/14/24  8:10 PM  Result Value Ref Range   Mg 1.5 (L) 1.6 - 2.6 mg/dL  Prognostic Gen5 Cardiac Troponin T   Collection Time: 07/14/24  8:10 PM  Result Value Ref Range   Prognostic Gen5 Cardiac Troponin T 8 <14 ng/L  ECG 12 lead   Collection Time: 07/14/24  8:11 PM  Result Value Ref Range   Acquisition Device D3K    Ventricular Rate 104 BPM   Atrial Rate 104 BPM   P-R Interval 118 ms   QRS Duration 72 ms   Q-T Interval 346 ms   QTC Calculation(Bazett) 454 ms   Calculated P Axis 73 degrees   Calculated R Axis 75 degrees   Calculated T Axis 63 degrees   ECG Diagnosis      Sinus  tachycardia Possible Left atrial enlargement Borderline ECG No previous ECGs available    Imaging: CT Abdomen Pelvis WO IV Contrast (Stone Protocol) Result Date: 07/14/2024 History:  Flank Pain Technique: Routine noncontrast CT abdomen and pelvis was performed.  Axial, coronal, and sagittal images were included.   Comparison:  06/17/2024 Interpretation:  LUNG BASES: The visualized lung bases are clear. LIVER: Normal unenhanced appearance of the liver.  GALLBLADDER: Cholecystectomy. SPLEEN: Normal. PANCREAS: Pancreatic head calcifications suggesting chronic pancreatitis. KIDNEYS: Normal appearance of the unenhanced kidneys.  There are no renal or ureteral calculi.  There is no hydronephrosis. ADRENALS: Normal. BOWEL: No bowel obstruction or evidence of bowel wall thickening.  No free air. APPENDIX: Normal. VASCULATURE, FLUID SURVEY, LYMPH NODES: No free fluid or adenopathy. BONES AND SOFT TISSUES: No acute osseous findings.   IMPRESSION: No acute findings. Please note that any pulmonary nodule follow up recommendations are in accordance with the Fleischner Society 2017 guidelines, unless otherwise stated. Electronically Signed by: Lynwood Portugal on 07/14/2024 7:24 PM  ECG: No results found.   GI prophylaxis: Time spent:  Electronically signed: Marissa Ely, MD 07/14/2024 / 8:53 PM

## 2024-07-14 NOTE — ED Provider Notes (Signed)
 ------------------------------------------------------------------------------- Attestation signed by Katheryn Rollo Jewels, DO at 07/15/24 [phone] Patient was seen by PA or NP and I was not involved in patient's work-up, care plan, treatment or disposition.  I was available for consultation at all points during patient's visit.   -------------------------------------------------------------------------------   [facility]  ED Provider Note  Rachel Pitts 63 y.o. female DOB: [birth year] MRN: [id number] History   Chief Complaint  Patient presents with   Abdominal Pain    Abd pain vomiting for 3 days hx of pancreatitis   63 year old female with a past medical history remarkable for chronic pancreatitis, chronic pain, chronic Pelot-term use of opiates, chronic diarrhea and hypertension presents today for evaluation of a 3-day history of vomiting and diarrhea.  She also endorses dysuria and possibly some mild hematuria.  She denies fever at home, headache, chest pain, dyspnea, palpitations or diaphoresis.   History provided by:  Patient Language interpreter used: No   Abdominal Pain      Past Medical History:  Diagnosis Date   Cancer (*)    cervical   Chronic pain syndrome    Chronic pancreatitis (*)    History of transfusion    no problems   Hypertension    Pancreatic disorder (*)    pancreas does not drain, pancreatitis   Pancreatic divisum    Port-A-Cath in place    upper left chest, no vein access   Wears dentures    upper    Past Surgical History:  Procedure Laterality Date   Cholecystectomy  07/25/1989   Colonoscopy N/A 06/26/2012   Procedure: COLONOSCOPY;  Surgeon: Bud SHAUNNA Amel, MD;  Location: [facility] ENDO;  Service: Gastroenterology;  Laterality: N/A;   Colposcopy  09/27/2023   due to 08/30/23 PAP smear showing ASCUS with high risk HPV, s/p hysterectomy, history of cervical cancer   Esophagogastroduodenoscopy N/A  06/26/2012   Procedure: EGD;  Surgeon: Bud SHAUNNA Amel, MD;  Location: [facility] ENDO;  Service: Gastroenterology;  Laterality: N/A;   Hysterectomy  07/25/2006    Social History   Substance and Sexual Activity  Alcohol Use No   Tobacco Use History[1] E-Cigarettes   Vaping Use Never User    Start Date     Cartridges/Day     Quit Date     Social History   Substance and Sexual Activity  Drug Use No         Allergies[2]  Current Discharge Medication List     CONTINUE these medications which have NOT CHANGED   Details  amlodipine -benazepril (LOTREL) 10-20 MG per capsule Take one capsule by mouth daily.    clobetasol (TEMOVATE) 0.05 % ointment Apply a thin layer twice daily for the next 4 weeks, then taper to a thin layer nightly for 4 weeks, then decrease to every other night x 4 weeks, then use indefinitely just twice a week at bedtime. Qty: 60 g, Refills: 2   Comments: Corrected scripts instructions as requested Associated Diagnoses: Lichen sclerosus of female genitalia    estradiol (ESTRACE) 0.1 mg/gram vaginal cream PLACE 1 GRAM VAGINALLY TWICE A WEEK FOR MAINTENANCE Qty: 42.5 g, Refills: 3   Associated Diagnoses: Genitourinary syndrome of menopause; Vaginal odor    eszopiclone (LUNESTA) 3 mg tablet Take three mg by mouth at bedtime.    metoprolol succinate (TOPROL-XL) 50 mg 24 hr tablet Take one tablet (50 mg dose) by mouth daily.    naloxone (NARCAN) nasal spray (TAKE HOME PACK) one spray by Intranasal route as needed for  Opioid Reversal. Qty: 1 each, Refills: 0    omeprazole (PRILOSEC) 20 MG capsule Take one capsule (20 mg dose) by mouth daily.    oxyCODONE  HCl (ROXICODONE ) 15 mg immediate release tablet Take one tablet (15 mg dose) by mouth every 6 (six) hours as needed.    promethazine  (PHENERGAN ) 25 MG tablet Take one tablet (25 mg dose) by mouth 3 (three) times a day.    sucralfate  (CARAFATE ) 1 g tablet Take one tablet (1 g dose) by mouth 3 (three) times  a day for 30 days. Qty: 90 tablet, Refills: 0    tiZANidine (ZANAFLEX) 4 mg tablet Take one tablet (4 mg dose) by mouth 3 (three) times a day as needed.        Primary Survey  Primary Survey  Review of Systems   Review of Systems  Gastrointestinal:  Positive for abdominal pain.  Refer to HPI for review of systems. Unless otherwise noted, the patient's 5-point ROS is negative.  Physical Exam   ED Triage Vitals [07/14/24 1703]  BP 140/82  Heart Rate 119  Resp 19  SpO2 99 %  Temp 98.1 F (36.7 C)    Physical Exam  Nursing note and vitals reviewed. Constitutional: She appears well-developed and well-nourished. She appears to be in pain and does not appear distressed.  HENT:  Head: Normocephalic and atraumatic.  Nose: Nose normal.  Eyes: EOM are intact. Conjunctivae are normal. Pupils are equal, round, and reactive to light.  Neck: Neck supple.  Cardiovascular: Normal rate, regular rhythm and normal heart sounds.  Pulmonary/Chest: Respiratory effort normal and breath sounds normal.  Abdominal: Soft. There is moderate abdominal tenderness in the right upper quadrant and epigastric area. There is no guarding and no rebound. Bowel sounds are normal. There is CVA tenderness (right). Normal appearance. There is no rigidity. There is a negative Murphy's sign. There is no tenderness at McBurney's point.  Musculoskeletal: Normal range of motion.     Cervical back: Neck supple.   Neurological: She is alert and oriented to person, place, and time.  Skin: Skin is warm. Skin is dry.  Psychiatric: She has a normal mood and affect. Her behavior is normal. Judgment and thought content normal.     ED Course   Lab results:   CBC AND DIFFERENTIAL - Abnormal      Result Value   WBC 18.8 (*)    RBC 4.37     HGB 13.6     HCT 38.5     MCV 88.1     MCH 31.1     MCHC 35.3     Plt Ct 277     RDW SD 38.4     MPV 10.2     NRBC% 0.0     Absolute NRBC Count 0.00     NEUTROPHIL % 83.0      LYMPHOCYTE % 12.0     MONOCYTE % 4.1     Eosinophil % 0.0     BASOPHIL % 0.3     IG% 0.6     ABSOLUTE NEUTROPHIL COUNT 15.60 (*)    ABSOLUTE LYMPHOCYTE COUNT 2.26     Absolute Monocyte Count 0.78     Absolute Eosinophil Count 0.00     Absolute Basophil Count 0.05     Absolute Immature Granulocyte Count 0.12 (*)   COMPREHENSIVE METABOLIC PANEL - Abnormal   Na 143     Potassium 3.4 (*)    Cl 102     CO2 20  AGAP 21 (*)    Glucose 146 (*)    BUN 16     Creatinine 1.10 (*)    Ca 9.7     ALK PHOS 109     T Bili 0.4     Total Protein 8.8 (*)    Alb 4.8     GLOBULIN 4.0     ALBUMIN/GLOBULIN RATIO 1.2     BUN/CREAT RATIO 14.5     ALT 22     AST 19     eGFR 57 (*)    Comment: Normal GFR (glomerular filtration rate) > 60 mL/min/1.73 meters squared, < 60 may include impaired kidney function. Calculation based on the Chronic Kidney Disease Epidemiology Collaboration (CK-EPI)equation refit without adjustment for race.  MAGNESIUM  - Abnormal   Mg 1.5 (*)   LIPASE - Normal   Lipase 54    PROGNOSTIC GEN5 CARDIAC TROPONIN T - Normal   Prognostic Gen5 Cardiac Troponin T 8     Comment: An elevated Troponin indicates myocardial damage. Elevated troponin may also be due to pulmonary emboli, aortic dissection, heart failure, trauma, toxins and ischemia in the setting of critical illness.   PHOSPHORUS - Normal   Phos 4.0    BIOFIRE GI PANEL  URINALYSIS W/MICRO REFLEX CULTURE - SYMPTOMATIC  POCT GLUCOSE  POCT GLUCOSE  POCT GLUCOSE  POCT GLUCOSE  POCT GLUCOSE  POCT GLUCOSE  POCT GLUCOSE  POCT GLUCOSE  POCT GLUCOSE  POCT GLUCOSE  LIGHT BLUE TOP  GOLD SST    Imaging:   CT ABDOMEN PELVIS WO IV CONTRAST (STONE PROTOCOL)   Narrative:    History:  Flank Pain  Technique: Routine noncontrast CT abdomen and pelvis was performed.  Axial, coronal, and sagittal images were included.     Comparison:  06/17/2024  Interpretation:    LUNG BASES: The visualized lung bases are  clear.  LIVER: Normal unenhanced appearance of the liver.    GALLBLADDER: Cholecystectomy.  SPLEEN: Normal.  PANCREAS: Pancreatic head calcifications suggesting chronic pancreatitis.  KIDNEYS: Normal appearance of the unenhanced kidneys.  There are no renal or ureteral calculi.  There is no hydronephrosis.   ADRENALS: Normal.  BOWEL: No bowel obstruction or evidence of bowel wall thickening.  No free air.   APPENDIX: Normal.  VASCULATURE, FLUID SURVEY, LYMPH NODES: No free fluid or adenopathy.  BONES AND SOFT TISSUES: No acute osseous findings.    Impression:    IMPRESSION: No acute findings.   Please note that any pulmonary nodule follow up recommendations are in accordance with the Fleischner Society 2017 guidelines, unless otherwise stated.  Electronically Signed by: Lynwood Portugal on 07/14/2024 7:24 PM     ECG: ECG Results          ECG 12 lead (In process)      Collection Time Result Time Acquisition Device Ventricular Rate Atrial Rate P-R Interval QRS Duration Q-T Interval QTC Calculation(Bazett) Calculated P Axis Calculated R Axis Calculated T Axis   07/14/24 20:11:09 07/14/24 20:29:48 D3K 104 104 118 72 346 454 73 75 63         Collection Time Result Time ECGDiag   07/14/24 20:11:09 07/14/24 20:29:48 Sinus tachycardia Possible Left atrial enlargement Borderline ECG No previous ECGs available  Pre-Sedation Procedures    Medical Decision Making Differential considerations include but are not limited to bowel obstruction, ileus, bowel perforation, PUD, gastric perforation, appendicitis, diverticulitis, hepatobiliary disease, pancreatitis, cystitis, pyelonephritis, renal colic, ureterolithiasis, hydronephrosis, mesenteric ischemia, colitis or gastroenteritis.  Patient nontoxic and healthy appearing.  Vital signs stable  but remarkable for mild tachycardia to 119.  She was alert and oriented throughout her ED course.  Examination as previously indicated.  Patient was treated with Zofran  and Dilaudid  for pain symptoms.  ED workup included a CBC, CMP, lipase and a noncontrast CT abdomen pelvis which revealed no acute intra-abdominal or pelvic pathology or etiology.  Labs with elevated WBC to 18.8 but otherwise reassuring and not indicative of acute etiology.  ECG with sinus tachycardia but without STEMI.  Patient pain symptoms difficult to control in the ER.  She was given Dilaudid , 1 mg but required increased dose and short time later.  Her nausea and vomiting was controlled with Zofran .  Plan: Consult placed with hospitalist Dr. Laveda who agreed to admit the patient.  Amount and/or Complexity of Data Reviewed Labs: ordered. Decision-making details documented in ED Course. Radiology: ordered. Decision-making details documented in ED Course. ECG/medicine tests: ordered. Decision-making details documented in ED Course.  Risk Prescription drug management. Decision regarding hospitalization.          Provider Communication  Current Discharge Medication List      Current Discharge Medication List      Current Discharge Medication List      Clinical Impression Final diagnoses:  Epigastric pain  Nausea and vomiting, unspecified vomiting type    ED Disposition     ED Disposition  Admit   Condition  --   Comment  --                   Electronically signed by:        [1] Social History Tobacco Use  Smoking Status Never   Passive exposure: Never  Smokeless Tobacco Never  [2] Allergies Allergen Reactions   Aspirin Anaphylaxis   Iodine Shortness Of Breath   Ivp [Iodinated Diagnostic Agents] Anaphylaxis   Ketorolac  Nausea And Vomiting   Ambien  Hallucinations   Morphine  And Related Nausea And Vomiting   Tramadol Nausea And Vomiting   Ozell Sherwood Banks,  PA-C 07/15/24 0246

## 2024-07-15 NOTE — Care Plan (Signed)
" °  Problem: Discharge Planning Intervention: Facilitate communication re: discharge plan with patient/caregiver and pertinent members of the healthcare team Note: RN CM met with pt for consult. Pt goal is to dc home.    "

## 2024-07-17 NOTE — Care Plan (Signed)
" °  Problem: Discharge Planning Goal: Knowledge of medical problems (What is my main problem?) Outcome: Adequate for Discharge Goal: Knowledge of self care (What do I need to do when I go home?) Outcome: Adequate for Discharge Goal: Knowledge of treatment plan (Why is it important for me to do this?) Outcome: Adequate for Discharge Goal: Knowledge of medication management Outcome: Adequate for Discharge   Problem: Injury Risk, Abnormal Glucose Level Goal: Glucose level within specified parameters 07/17/2024 1402 by Toribio Box, RN, BSN Outcome: Adequate for Discharge 07/17/2024 1012 by Toribio Box, RN, BSN Outcome: Progressing   Problem: Sensory Perception - Impaired Goal: Absence of physical injury 07/17/2024 1402 by Toribio Box, RN, BSN Outcome: Adequate for Discharge 07/17/2024 1012 by Toribio Box, RN, BSN Outcome: Progressing   Problem: Deep Venous Thrombosis, Risk of Goal: Absence of deep venous thrombosis Outcome: Adequate for Discharge   Problem: Bleeding, Risk of Goal: Absence of active bleeding 07/17/2024 1402 by Toribio Box, RN, BSN Outcome: Adequate for Discharge 07/17/2024 1012 by Toribio Box, RN, BSN Outcome: Progressing Goal: Absence of impaired coagulation signs and symptoms 07/17/2024 1402 by Toribio Box, RN, BSN Outcome: Adequate for Discharge 07/17/2024 1012 by Toribio Box, RN, BSN Outcome: Progressing   Problem: Fall Prevention Goal: Absence of falls 07/17/2024 1402 by Toribio Box, RN, BSN Outcome: Adequate for Discharge 07/17/2024 1012 by Toribio Box, RN, BSN Outcome: Progressing   Problem: Fluid Volume Deficit Goal: Absence of fluid volume deficit signs and symptoms Outcome: Adequate for Discharge Goal: Electrolytes within specified parameters Outcome: Adequate for Discharge   Problem: Nausea/Vomiting Goal: Absence of nausea/vomiting 07/17/2024 1402 by Toribio Box, RN, BSN Outcome: Adequate for Discharge 07/17/2024 1012  by Toribio Box, RN, BSN Outcome: Progressing   Problem: Nutrition Goal: Adequate nutritional intake 07/17/2024 1402 by Toribio Box, RN, BSN Outcome: Adequate for Discharge 07/17/2024 1012 by Toribio Box, RN, BSN Outcome: Progressing   Problem: Pain - Acute Goal: Reduced pain sensation 07/17/2024 1402 by Toribio Box, RN, BSN Outcome: Adequate for Discharge 07/17/2024 1012 by Toribio Box, RN, BSN Outcome: Progressing   Problem: Risk of injury - Alcohol Withdrawal Goal: Absence of alcohol withdrawal signs and symptoms Outcome: Adequate for Discharge Goal: Absence of injury 07/17/2024 1402 by Toribio Box, RN, BSN Outcome: Adequate for Discharge 07/17/2024 1012 by Toribio Box, RN, BSN Outcome: Progressing   Problem: Electrolyte Imbalance Risk Goal: Electrolytes within specified parameters Outcome: Adequate for Discharge   "

## 2024-07-17 NOTE — Discharge Summary (Signed)
 [facility] Discharge Summary  PCP: Norman Larve, NP  Discharge Details   Admit date:         07/14/2024 Discharge date:        07/17/2024  Hospital LOS:    3 days  Discharge Service: [facility]  Discharge Attending Physician: Ethel FORBES Mccreedy, MD  Discharge to: home  Condition at Discharge: good  Code Status: Full Code  Discharge Diagnoses   Active Hospital Problems   Diagnosis Date Noted POA   *Intractable nausea and vomiting 12/17/2012 Yes   GERD (gastroesophageal reflux disease) 07/15/2024 Unknown   Hypomagnesemia 07/14/2024 Yes   Essential hypertension 04/06/2023 Yes   Chronic pancreatitis (*) 09/16/2016 Yes    Resolved Hospital Problems  No resolved problems to display.     Discharge Medications     Current Discharge Medication List     START taking these medications      Details  docusate sodium  capsule Commonly known as: COLACE,DOK,DOCQLACE  Take one capsule (100 mg dose) by mouth 2 (two) times daily for 10 days. Quantity: 20 capsule       CONTINUE these medications which have CHANGED      Details  promethazine  25 MG tablet Commonly known as: PHENERGAN  What changed:  when to take this reasons to take this  Take one tablet (25 mg dose) by mouth every 6 (six) hours as needed for Nausea. Indication: Nausea and Vomiting Quantity: 20 tablet       CONTINUE these medications which have NOT CHANGED      Details  amlodipine -benazepril 10-20 MG per capsule Commonly known as: LOTREL  Take one capsule by mouth daily.   estradiol 0.1 mg/gram vaginal cream Commonly known as: ESTRACE  PLACE 1 GRAM VAGINALLY TWICE A WEEK FOR MAINTENANCE Quantity: 42.5 g   eszopiclone 3 mg tablet Commonly known as: LUNESTA  Take three mg by mouth at bedtime.   naloxone nasal spray (TAKE HOME PACK) Commonly known as: NARCAN  one spray by Intranasal route as needed for Opioid Reversal. Quantity: 1 each   omeprazole 20  mg capsule Commonly known as: PRILOSEC  Take one capsule (20 mg dose) by mouth daily.   oxyCODONE  HCl 15 mg immediate release tablet Commonly known as: ROXICODONE   Take one tablet (15 mg dose) by mouth every 6 (six) hours as needed.   sucralfate  1 g tablet Commonly known as: CARAFATE   Take one tablet (1 g dose) by mouth 3 (three) times a day for 30 days. Quantity: 90 tablet   tiZANidine 4 mg tablet Commonly known as: ZANAFLEX  Take one tablet (4 mg dose) by mouth 3 (three) times a day as needed.      * You might also be taking other medications not listed above. If you have questions about any of your other medications, talk to the person who prescribed them or your Primary Care Provider.          STOP taking these medications    clobetasol 0.05 % ointment Commonly known as: TEMOVATE   metoprolol succinate 50 mg 24 hr tablet Commonly known as: TOPROL-XL         Procedures   Bedside Procedures   No orders found      Hospital Course  Physicians involved in care during this hospitalization Attending Provider: Marissa Ely, MD Attending Provider: Rush JONETTA Essex, MD Attending Provider: Ethel FORBES Mccreedy, MD Admitting Provider: Marissa Ely, MD Consulting Physician: Marissa Ely, MD  Indication for Admission:  History of Present Illness per admitting provider: [name] is a 63 year old female with past medical history of chronic pancreatitis, HTN. 3 days ago she developed nausea, vomiting, diarrhea [by diarrhea]. She stated she is unable to keep even water down. She endorsed severe abdominal pain greater on the right side radiating to the back. She stated she is weak and lightheaded. She endorsed chronic shortness of breath but states is worse now. She has a history of chronic pancreatitis. She came to the ER   Hospital Course:  Patient presented to the ED with complaints of abd pain, n/v.  Admitted for possible acute on chronic pancreatitis although  normal lipase and no evidence of acute etiology on CT imaging.  Patient treated with conservative management, NPO initially, IVFs.  Patient's nausea and abd pain improved.  Tolerating gi soft by discharge.  Discharged on PO Phenergan  pRN as she reports that med works best for her.  Abd pain still present but improved, patient to resume her home narcotic pain regimen.  Will refer to follow up with GI outpatient.   Further details in in A&P format:    Intractable N/V, abd pain -appears to be a chronic issue, no acute etiology determined, possible viral gastroenteritis -admitted to medical floor -patient with hx of pancreatitis although CT abdomen pelvis with no acute intra-abdominal or pelvic pathology or etiology and normal Lipase -IVF hydration given  >> now off -pain management PRN -antiemetics PRN -repeat abd XR shows mod stool burden, no SBO -NPO initially >> advance diet as tolerated, tolerating GI by discharge -follow up with GI outpatient.   HTN -BP stable, can resume home regimen upon discharge  -Antihypertensives PRN  for severely elevated BP.   -Monitored closely and adjusted as needed.   Leukocytosis- resolved -likely reactive from n/v   AKI Hypokalemia Hypomagnesemia -resolved on last check -IVFs as above -replete electrolytes as needed  GERD -PPI  Constipation -likely related to chronic narcotic pain medications use -start Colace BID -Lactulose x 1 -suppository PRN  Chronic pain syndrome -follows pain clinic -resumed home pain meds- oxycodone  15mg  q6h    Exam at Discharge  BP 139/86 (BP Location: Left Upper Arm, Patient Position: Lying)   Pulse 78   Temp 98.2 F (36.8 C) (Oral)   Resp 17   Ht 1.524 m (5')   Wt 52.6 kg (116 lb)   SpO2 98%   BMI 22.65 kg/m   General: Comfortable, no distress, alert, awake   Neck: Supple, No JVP, normal carotid upstrokes    Cardiac: Regular rate and rhythm, +S1S2, no rub, no gallop   Lungs: CTA of all other lung  fields, unlabored breathing    Abd: Soft, nontender, normal bowel sounds    Ext: No peripheral edema bilaterally   Neuro: non-focal    Post Hospital Care   Discharge Procedure Orders  Follow-up with Primary Care Physician  Standing Status: Future  Referral Priority: Routine Referral Type: Consultation  Referral Reason: Evaluate and Return  Number of Visits Requested: 1 Expiration Date: 01/12/25   Ambulatory referral to Gastroenterology  Standing Status: Future  Referral Priority: Routine Referral Type: Consultation  Referral Reason: Evaluate and Return  Requested Specialty: Gastroenterology  Number of Visits Requested: 1 Expiration Date: 01/12/25   Cardiac diet (heart healthy)   Activity as tolerated   Notify physician - Temp  Order Comments: Call MD if Temperature above 102  Degrees F   Notify Physician for increased or unrelieved pain   Notify Physician for  persistent vomiting   Notify Physician for increased abdominal swelling or bloating   Appointments which have been scheduled    Aug 30, 2024 11:00 AM Annual Physical with Romero JAYSON Louder, NP [facility] [place] (--) [address] [place] [address] [phone]        Recommendations to physicians: none Potential for Rehab/Home health orders: none   Time spent in discharge process:  35 minutes   This note was dictated with voice recognition software. Similar sounding words can inadvertently be transcribed and may not be corrected upon review  Electronically signed: Laray Bar, PA-C 07/17/2024 / 1:21 PM  *Some images could not be shown.

## 2024-07-17 NOTE — Progress Notes (Signed)
 [facility] Case Management Discharge Note   Patient:   Rachel Pitts MR Number:  [id number] Patient Date of Birth: 1961/06/18 Age/Sex:  63 y.o./female   Discharge Plan   Case Management interviewed: Patient Disposition: Home    Discussed readiness, willingness and ability to provide or support self-management activities when needed after discharge from the acute care setting with: Patient Coordinated Support Services: No Needs     Transportation   Does the patient need discharge transport arranged?: No   Who is arranging transport?: Patient Mode of transportation?: private vehicle    Accepted Agency   Selected Continued Care - Discharged on 07/17/2024 Admission date: 07/14/2024 - Discharge disposition: Home or Self Care  No services have been selected for the patient.    The patient is medically stable to discharge today. Patient in agreement with discharge plan. Case Management has assessed this patient/family or caregiver's readiness, willingness and ability to provide or support self-management activities when needed after discharge from the acute care setting. The level of care algorithm was used for appropriate discharge planning and the patient will be discharged home via spouse.  Electronically signed: Harlene Sharps 07/17/2024 2:25 PM

## 2024-07-22 NOTE — Progress Notes (Signed)
 Patient contacted through [employer]xim[employer] dialer  Hello Jenetta Wease Ciocca, this is Josette GORMAN Baker, CMA from [facility]. I tried reaching you to check in on your progress after your recent visit, but I was unable to get in touch. Please give us  a call back at [phone] at your earliest convenience. Unfortunately, you cannot respond to this message at this time.  Thank you!

## 2024-07-22 NOTE — Progress Notes (Signed)
 CARE COORDINATOR CONTACT WITH PATIENT/CAREGIVER AFTER HOSPITAL DISCHARGE   Unable to contact patient to encourage to schedule hospital follow up appointment with their Non NH PCP.  Left voice mail with coordinator's contact information and availability.

## 2024-08-11 NOTE — ED Notes (Signed)
 Patient states she is going to just send me home and not give me anymore pain medication I am leaviing

## 2024-08-11 NOTE — ED Notes (Signed)
 Informed [employer] PA but patient left before she was able to talk to patient or do paperwork

## 2024-08-12 ENCOUNTER — Emergency Department (HOSPITAL_BASED_OUTPATIENT_CLINIC_OR_DEPARTMENT_OTHER)
Admission: EM | Admit: 2024-08-12 | Discharge: 2024-08-12 | Disposition: A | Discharge status: Home / Self Care | Attending: Emergency Medicine | Admitting: Emergency Medicine

## 2024-08-12 ENCOUNTER — Other Ambulatory Visit: Payer: Self-pay

## 2024-08-12 ENCOUNTER — Encounter (HOSPITAL_BASED_OUTPATIENT_CLINIC_OR_DEPARTMENT_OTHER): Payer: Self-pay

## 2024-08-12 DIAGNOSIS — R112 Nausea with vomiting, unspecified: Secondary | ICD-10-CM | POA: Insufficient documentation

## 2024-08-12 DIAGNOSIS — R1013 Epigastric pain: Secondary | ICD-10-CM | POA: Diagnosis present

## 2024-08-12 DIAGNOSIS — F1193 Opioid use, unspecified with withdrawal: Secondary | ICD-10-CM | POA: Insufficient documentation

## 2024-08-12 DIAGNOSIS — D72829 Elevated white blood cell count, unspecified: Secondary | ICD-10-CM | POA: Diagnosis not present

## 2024-08-12 DIAGNOSIS — R1011 Right upper quadrant pain: Secondary | ICD-10-CM | POA: Diagnosis not present

## 2024-08-12 DIAGNOSIS — G8929 Other chronic pain: Secondary | ICD-10-CM | POA: Diagnosis not present

## 2024-08-12 LAB — URINALYSIS, ROUTINE W REFLEX MICROSCOPIC
Glucose, UA: NEGATIVE mg/dL
Ketones, ur: >=80 mg/dL — AB
Leukocytes,Ua: NEGATIVE
Nitrite: NEGATIVE
Protein, ur: 30 mg/dL — AB
Specific Gravity, Urine: 1.025 (ref 1.005–1.030)
pH: 5.5 (ref 5.0–8.0)

## 2024-08-12 LAB — COMPREHENSIVE METABOLIC PANEL WITH GFR
ALT: 11 U/L (ref 0–44)
AST: 17 U/L (ref 15–41)
Albumin: 4.7 g/dL (ref 3.5–5.0)
Alkaline Phosphatase: 78 U/L (ref 38–126)
Anion gap: 23 — ABNORMAL HIGH (ref 5–15)
BUN: 10 mg/dL (ref 8–23)
CO2: 16 mmol/L — ABNORMAL LOW (ref 22–32)
Calcium: 9.6 mg/dL (ref 8.9–10.3)
Chloride: 105 mmol/L (ref 98–111)
Creatinine, Ser: 1.01 mg/dL — ABNORMAL HIGH (ref 0.44–1.00)
GFR, Estimated: >60 mL/min
Glucose, Bld: 118 mg/dL — ABNORMAL HIGH (ref 70–99)
Potassium: 3.4 mmol/L — ABNORMAL LOW (ref 3.5–5.1)
Sodium: 144 mmol/L (ref 135–145)
Total Bilirubin: 0.6 mg/dL (ref 0.0–1.2)
Total Protein: 8.2 g/dL — ABNORMAL HIGH (ref 6.5–8.1)

## 2024-08-12 LAB — URINALYSIS, MICROSCOPIC (REFLEX)

## 2024-08-12 LAB — CBC
HCT: 37.1 % (ref 36.0–46.0)
Hemoglobin: 13.1 g/dL (ref 12.0–15.0)
MCH: 30.5 pg (ref 26.0–34.0)
MCHC: 35.3 g/dL (ref 30.0–36.0)
MCV: 86.5 fL (ref 80.0–100.0)
Platelets: 333 K/uL (ref 150–400)
RBC: 4.29 MIL/uL (ref 3.87–5.11)
RDW: 12 % (ref 11.5–15.5)
WBC: 19.7 K/uL — ABNORMAL HIGH (ref 4.0–10.5)
nRBC: 0 % (ref 0.0–0.2)

## 2024-08-12 LAB — LIPASE, BLOOD: Lipase: 42 U/L (ref 11–51)

## 2024-08-12 MED ORDER — PROMETHAZINE HCL 25 MG PO TABS: 25.0000 mg | ORAL_TABLET | Freq: Four times a day (QID) | ORAL | 0 refills | Status: AC | PRN

## 2024-08-12 MED ORDER — HYDROMORPHONE HCL 1 MG/ML IJ SOLN
1.0000 mg | Freq: Once | INTRAMUSCULAR | Status: AC
  Administered 2024-08-12: 1 mg via INTRAVENOUS
  Filled 2024-08-12: qty 1

## 2024-08-12 MED ORDER — ONDANSETRON HCL 4 MG/2ML IJ SOLN
4.0000 mg | Freq: Once | INTRAMUSCULAR | Status: AC
  Administered 2024-08-12: 4 mg via INTRAVENOUS
  Filled 2024-08-12: qty 2

## 2024-08-12 MED ORDER — SODIUM CHLORIDE 0.9 % IV BOLUS
1000.0000 mL | Freq: Once | INTRAVENOUS | Status: AC
  Administered 2024-08-12: 1000 mL via INTRAVENOUS

## 2024-08-12 MED ORDER — OXYCODONE-ACETAMINOPHEN 5-325 MG PO TABS
2.0000 | ORAL_TABLET | Freq: Once | ORAL | Status: AC
  Administered 2024-08-12: 2 via ORAL
  Filled 2024-08-12: qty 2

## 2024-08-12 MED ORDER — LACTATED RINGERS IV BOLUS
1000.0000 mL | Freq: Once | INTRAVENOUS | Status: AC
  Administered 2024-08-12: 1000 mL via INTRAVENOUS

## 2024-08-12 NOTE — ED Triage Notes (Signed)
 Epigastric and RUQ abd pain, NVD for 2 weeks.   Hx of chronic pancreatitis

## 2024-08-12 NOTE — ED Provider Notes (Signed)
 " [name] [facility] Provider Note   CSN: [id number] Arrival date & time: 08/12/24  1531     Patient presents with: Abdominal Pain   Rachel Pitts is a 64 y.o. female.   Patient complains of pain from pancreatitis.  Patient reports that she has had pancreatitis for years.  Patient states that she is having an exacerbation of her pancreatitis.  Patient states that she had an injury to her pancreas when she had her gallbladder removed.  Patient reports that this occurred at [facility] in [place] .  Patient reports she has struggled with pancreatitis since that time.  Patient was seen yesterday at [facility].  Patient is here today with her husband who is also a patient.  Patient reports that she has had difficulty keeping her pain medicine down.  Patient states that she has had vomiting every day for 8 days.  The history is provided by the patient. No language interpreter was used.  Abdominal Pain Associated symptoms: nausea and vomiting        Prior to Admission medications  Medication Sig Start Date End Date Taking? Authorizing Provider  amLODipine -benazepril (LOTREL) 10-20 MG capsule Take 1 capsule by mouth daily. 06/28/24  Yes [provider]  traZODone (DESYREL) 100 MG tablet Take 100 mg by mouth at bedtime as needed. 08/01/24  Yes [provider]  amLODipine  (NORVASC ) 10 MG tablet Take 1 tablet (10 mg total) by mouth daily. 04/09/23 07/08/23  Laurence Locus, DO  Eszopiclone 3 MG TABS Take 3 mg by mouth at bedtime. 10/28/22   [provider]  omeprazole (PRILOSEC) 20 MG capsule Take 20 mg by mouth daily. 08/08/17   [provider]  ondansetron  (ZOFRAN ) 4 MG tablet Take 1 tablet (4 mg total) by mouth every 4 (four) hours as needed for nausea or vomiting. Patient not taking: Reported on 04/06/2023 01/11/23   Elnor Savant A, DO  oxyCODONE  (ROXICODONE ) 15 MG immediate release tablet Take 1 tablet (15 mg total) by mouth every 6  (six) hours as needed for pain. 11/18/22   [place], Locus PARAS, DO  promethazine  (PHENERGAN ) 25 MG tablet Take 1 tablet (25 mg total) by mouth every 6 (six) hours as needed for nausea or vomiting. 12/09/22   Desiderio Chew, PA-C  sucralfate  (CARAFATE ) 1 g tablet Take 1 tablet (1 g total) by mouth with breakfast, with lunch, and with evening meal for 7 days. 01/11/23 01/18/23  Elnor Savant LABOR, DO  tiZANidine (ZANAFLEX) 4 MG tablet Take 4 mg by mouth 3 (three) times daily. 11/23/22   [provider]  Vitamin D, Ergocalciferol, (DRISDOL) 1.25 MG (50000 UNIT) CAPS capsule Take 50,000 Units by mouth once a week. 02/24/23   [provider]    Allergies: Aspirin, Iodine, Ivp dye [iodinated contrast media], Ketorolac , Morphine  and codeine, and Tramadol    Review of Systems  Gastrointestinal:  Positive for abdominal pain, nausea and vomiting.    Updated Vital Signs BP (!) 163/91   Pulse (!) 112   Temp 99.1 F (37.3 C) (Oral)   Resp 18   SpO2 100%   Physical Exam Vitals and nursing note reviewed.  Constitutional:      Appearance: She is well-developed.  HENT:     Head: Normocephalic.  Eyes:     Extraocular Movements: Extraocular movements intact.     Pupils: Pupils are equal, round, and reactive to light.  Cardiovascular:     Rate and Rhythm: Normal rate.  Pulmonary:  Effort: Pulmonary effort is normal.  Abdominal:     General: Abdomen is flat. Bowel sounds are normal. There is no distension.     Tenderness: There is abdominal tenderness.  Musculoskeletal:        General: Normal range of motion.     Cervical back: Normal range of motion.  Skin:    General: Skin is warm.  Neurological:     General: No focal deficit present.     Mental Status: She is alert and oriented to person, place, and time.     (all labs ordered are listed, but only abnormal results are displayed) Labs Reviewed  COMPREHENSIVE METABOLIC PANEL WITH GFR - Abnormal; Notable for the following  components:      Result Value   Potassium 3.4 (*)    CO2 16 (*)    Glucose, Bld 118 (*)    Creatinine, Ser 1.01 (*)    Total Protein 8.2 (*)    Anion gap 23 (*)    All other components within normal limits  CBC - Abnormal; Notable for the following components:   WBC 19.7 (*)    All other components within normal limits  URINALYSIS, ROUTINE W REFLEX MICROSCOPIC - Abnormal; Notable for the following components:   Hgb urine dipstick MODERATE (*)    Bilirubin Urine SMALL (*)    Ketones, ur >=80 (*)    Protein, ur 30 (*)    All other components within normal limits  URINALYSIS, MICROSCOPIC (REFLEX) - Abnormal; Notable for the following components:   Bacteria, UA FEW (*)    Non Squamous Epithelial PRESENT (*)    All other components within normal limits  LIPASE, BLOOD    EKG: EKG Interpretation Date/Time:  Monday August 12 2024 15:50:38 EST Ventricular Rate:  105 PR Interval:  115 QRS Duration:  71 QT Interval:  426 QTC Calculation: 564 R Axis:   77  Text Interpretation: Sinus tachycardia rate related ST depression otherwise no sig change from previous Confirmed by Armenta Canning [phone]) on 08/12/2024 4:11:47 PM  Radiology: No results found.   Procedures   Medications Ordered in the ED  HYDROmorphone  (DILAUDID ) injection 1 mg (has no administration in time range)  oxyCODONE -acetaminophen  (PERCOCET/ROXICET) 5-325 MG per tablet 2 tablet (has no administration in time range)  ondansetron  (ZOFRAN ) injection 4 mg (4 mg Intravenous Given 08/12/24 1653)  HYDROmorphone  (DILAUDID ) injection 1 mg (1 mg Intravenous Given 08/12/24 1655)  sodium chloride  0.9 % bolus 1,000 mL (0 mLs Intravenous Stopped 08/12/24 1754)  HYDROmorphone  (DILAUDID ) injection 1 mg (1 mg Intravenous Given 08/12/24 1753)  lactated ringers  bolus 1,000 mL (1,000 mLs Intravenous New Bag/Given 08/12/24 1820)  ondansetron  (ZOFRAN ) injection 4 mg (4 mg Intravenous Given 08/12/24 1912)                                     Medical Decision Making Patient complains of pain from pancreatitis.  Patient reports that she has a [place] history of pancreatitis.  Amount and/or Complexity of Data Reviewed External Data Reviewed: notes.    Details: Notes from ED visit at [facility] yesterday reviewed patient had a CT chest abdomen and pelvis that showed no acute findings. Patient's previous GI and surgery notes reviewed. Patient's previous labs were reviewed.  Patient has not presented with an elevated lipase on any of her visits over the last year.  Patient has had multiple CT scans which have not shown  any acute pancreatitis. Labs: ordered. Decision-making details documented in ED Course.    Details: Labs ordered reviewed and interpreted.  Patient has an elevated white blood cell count of 19.  Lipase is normal.  Risk Prescription drug management. Risk Details: Patient is given IV fluids x 2 L.  Patient is given 3 doses of Dilaudid  and Zofran .  Patient reports no relief from pain and continues to ask for pain medication.  I discussed symptoms with patient.  I suspect that she is having some withdrawal as she reports she has not been able to take her home pain medication.  I will give her 2 oxycodone  tablets here to make sure that she can take her home narcotics.  Patient is advised to discuss her pain management with her pain management provider.        Final diagnoses:  Other chronic pain  Narcotic withdrawal [facility])    ED Discharge Orders     None       An After Visit Summary was printed and given to the patient.    Flint Sonny POUR, PA-C 08/12/24 1943  "

## 2024-08-12 NOTE — ED Notes (Signed)

## 2024-08-14 ENCOUNTER — Other Ambulatory Visit: Payer: Self-pay

## 2024-08-14 ENCOUNTER — Emergency Department (HOSPITAL_BASED_OUTPATIENT_CLINIC_OR_DEPARTMENT_OTHER)
Admission: EM | Admit: 2024-08-14 | Discharge: 2024-08-14 | Disposition: A | Discharge status: Home / Self Care | Attending: Emergency Medicine | Admitting: Emergency Medicine

## 2024-08-14 ENCOUNTER — Encounter (HOSPITAL_BASED_OUTPATIENT_CLINIC_OR_DEPARTMENT_OTHER): Payer: Self-pay | Admitting: Emergency Medicine

## 2024-08-14 DIAGNOSIS — G8929 Other chronic pain: Secondary | ICD-10-CM | POA: Insufficient documentation

## 2024-08-14 DIAGNOSIS — R1084 Generalized abdominal pain: Secondary | ICD-10-CM | POA: Insufficient documentation

## 2024-08-14 DIAGNOSIS — I10 Essential (primary) hypertension: Secondary | ICD-10-CM | POA: Diagnosis not present

## 2024-08-14 DIAGNOSIS — Z8541 Personal history of malignant neoplasm of cervix uteri: Secondary | ICD-10-CM | POA: Diagnosis not present

## 2024-08-14 MED ORDER — DICYCLOMINE HCL 20 MG PO TABS: 20.0000 mg | ORAL_TABLET | Freq: Three times a day (TID) | ORAL | 0 refills | Status: AC | PRN

## 2024-08-14 MED ORDER — PROMETHAZINE HCL 25 MG RE SUPP: 25.0000 mg | Freq: Four times a day (QID) | RECTAL | 0 refills | Status: AC | PRN

## 2024-08-14 MED ORDER — DICYCLOMINE HCL 10 MG/ML IM SOLN
20.0000 mg | Freq: Once | INTRAMUSCULAR | Status: AC
  Administered 2024-08-14: 20 mg via INTRAMUSCULAR
  Filled 2024-08-14: qty 2

## 2024-08-14 NOTE — Discharge Instructions (Signed)
 You have been seen in the Emergency Department (ED) for abdominal pain.  Your evaluation did not identify a clear cause of your symptoms but was generally reassuring. Your recent CT scans and labs have been reassuring.   Please follow up as instructed above regarding todays emergent visit and the symptoms that are bothering you.  Please follow closely with your PCP and I have listed the name of a gastroenterologist to call for follow up.

## 2024-08-14 NOTE — ED Triage Notes (Signed)
 Pt c/o epigastric abd pain x a couple of weeks.  Seen yesterday for same.   Reports +n/v/d

## 2024-08-14 NOTE — ED Provider Notes (Signed)
 "  Emergency Department Provider Note   I have reviewed the triage vital signs and the nursing notes.   HISTORY  Chief Complaint Abdominal Pain   HPI Rachel Pitts is a 64 y.o. female with past history of chronic abdominal pain and hypertension presents to the emergency department with return of vomiting and generalized abdominal pain.  She states it feels similar to her ongoing abdominal discomfort that she has had intermittently for years.  She is on pain and nausea medications at home.  She has had frequent emergency department visits both in our system and the [facility] system.  She was seen here on 1/19 with labs and CT imaging of the abdomen and pelvis.  There was no finding radiographically or in labs to suggest pancreatitis.  She has not had fevers.  No UTI symptoms.    Past Medical History:  Diagnosis Date   Cancer (HCC)    cervical   GERD (gastroesophageal reflux disease)    Hypertension    Irregular heart rate    Pancreatitis     Review of Systems  Constitutional: No fever/chills Cardiovascular: Denies chest pain. Respiratory: Denies shortness of breath. Gastrointestinal: Positive abdominal pain. Positive nausea and vomiting.  No diarrhea.   Skin: Negative for rash. Neurological: Negative for headaches.   ____________________________________________   PHYSICAL EXAM:  VITAL SIGNS: ED Triage Vitals  Encounter Vitals Group     BP 08/14/24 1208 (!) 186/99     Pulse Rate 08/14/24 1208 95     Resp 08/14/24 1208 18     Temp 08/14/24 1208 98.6 F (37 C)     Temp src --      SpO2 08/14/24 1208 99 %     Weight 08/14/24 1207 110 lb (49.9 kg)     Height 08/14/24 1207 5' 3 (1.6 m)   Constitutional: Alert and oriented. Grimacing at times but able to provide a full history.  Eyes: Conjunctivae are normal.  Head: Atraumatic. Nose: No congestion/rhinnorhea. Mouth/Throat: Mucous membranes are moist.  Neck: No stridor.   Cardiovascular: Normal rate, regular rhythm.  Good peripheral circulation. Grossly normal heart sounds.   Respiratory: Normal respiratory effort.  No retractions. Lungs CTAB. Gastrointestinal: Soft and nontender. No distention.  Musculoskeletal: No gross deformities of extremities. Neurologic:  Normal speech and language.  Skin:  Skin is warm, dry and intact. No rash noted.  ____________________________________________   PROCEDURES  Procedure(s) performed:   Procedures  None  ____________________________________________   INITIAL IMPRESSION / ASSESSMENT AND PLAN / ED COURSE  Pertinent labs & imaging results that were available during my care of the patient were reviewed by me and considered in my medical decision making (see chart for details).   This patient is Presenting for Evaluation of abdominal pain, which does require a range of treatment options, and is a complaint that involves a high risk of morbidity and mortality.  The Differential Diagnoses includes but is not exclusive to acute cholecystitis, intrathoracic causes for epigastric abdominal pain, gastritis, duodenitis, pancreatitis, small bowel or large bowel obstruction, abdominal aortic aneurysm, hernia, gastritis, etc.   Critical Interventions-    Medications  dicyclomine  (BENTYL ) injection 20 mg (has no administration in time range)    Reassessment after intervention:  pain improved slightly.    I decided to review pertinent External Data, and in summary patient with CT abdomen pelvis on 08/11/2024 in the [facility] system with no acute findings.  Labs from prior visit show normal lipase, normal LFTs, and no acute kidney injury.  Clinical Laboratory Tests: Patient with labs on 1/18 and 1/19.  No acute kidney injury.  Lipase normal.  LFTs normal. Defer additional labs at this time in the setting of known chronic abdominal pain.   Radiologic Tests: Patient with CT imaging at [facility] on 1/18.  Abdomen diffusely soft and nontender here.  Normal vitals other than  elevated blood pressure.  Plan to defer imaging in the setting of known chronic abdominal pain.  Medical Decision Making: Summary:  Patient presents to the emergency department with report of vomiting and abdominal pain.  Nontender on exam.  Vital signs are reassuring.  After chart review and discussion with patient I advised that I am concerned about treating her chronic abdominal pain with IV opiates, namely Dilaudid .  She has listed allergies to others.  We discussed rebound pain and treating chronic pain with opiates.  Offered IM Bentyl  and will send PR Phenergan  to her pharmacy so that she has an option when she is unable to keep down liquids.  I gave contact information for my on-call gastroenterology group and advised that she call her PCP for close follow-up in the next 1 to 2 weeks.  Patient's presentation is most consistent with exacerbation of chronic illness.   Disposition: discharge  ____________________________________________  FINAL CLINICAL IMPRESSION(S) / ED DIAGNOSES  Final diagnoses:  Chronic abdominal pain     NEW OUTPATIENT MEDICATIONS STARTED DURING THIS VISIT:  New Prescriptions   DICYCLOMINE  (BENTYL ) 20 MG TABLET    Take 1 tablet (20 mg total) by mouth 3 (three) times daily as needed for spasms.   PROMETHAZINE  (PHENERGAN ) 25 MG SUPPOSITORY    Place 1 suppository (25 mg total) rectally every 6 (six) hours as needed for nausea or vomiting.    Note:  This document was prepared using Dragon voice recognition software and may include unintentional dictation errors.  Fonda Law, MD, [facility] Emergency Medicine    Donnell Wion, Fonda MATSU, MD 08/14/24 1255  "
